# Patient Record
Sex: Female | Born: 1955 | Race: Black or African American | Hispanic: No | Marital: Single | Smoking: Former smoker
Health system: Southern US, Community
[De-identification: ages and names within clinical notes are randomized; demographics above are authoritative.]

## PROBLEM LIST (undated history)

## (undated) ENCOUNTER — Emergency Department (HOSPITAL_COMMUNITY): Admission: EM | Payer: BC Managed Care – PPO | Source: Home / Self Care

## (undated) DIAGNOSIS — G4730 Sleep apnea, unspecified: Secondary | ICD-10-CM

## (undated) DIAGNOSIS — I499 Cardiac arrhythmia, unspecified: Secondary | ICD-10-CM

## (undated) DIAGNOSIS — Z8719 Personal history of other diseases of the digestive system: Secondary | ICD-10-CM

## (undated) DIAGNOSIS — J189 Pneumonia, unspecified organism: Secondary | ICD-10-CM

## (undated) DIAGNOSIS — I739 Peripheral vascular disease, unspecified: Secondary | ICD-10-CM

## (undated) DIAGNOSIS — K219 Gastro-esophageal reflux disease without esophagitis: Secondary | ICD-10-CM

## (undated) DIAGNOSIS — F32A Depression, unspecified: Secondary | ICD-10-CM

## (undated) DIAGNOSIS — R600 Localized edema: Secondary | ICD-10-CM

## (undated) DIAGNOSIS — F419 Anxiety disorder, unspecified: Secondary | ICD-10-CM

## (undated) HISTORY — PX: CHOLECYSTECTOMY: SHX55

## (undated) HISTORY — DX: Localized edema: R60.0

## (undated) HISTORY — DX: Gastro-esophageal reflux disease without esophagitis: K21.9

## (undated) HISTORY — PX: COLONOSCOPY: SHX174

## (undated) HISTORY — PX: CATARACT EXTRACTION W/ INTRAOCULAR LENS IMPLANT: SHX1309

---

## 2001-11-04 ENCOUNTER — Other Ambulatory Visit: Admission: RE | Admit: 2001-11-04 | Discharge: 2001-11-04 | Payer: Self-pay | Admitting: Family Medicine

## 2007-07-17 ENCOUNTER — Encounter: Admission: RE | Admit: 2007-07-17 | Discharge: 2007-07-17 | Payer: Self-pay | Admitting: Internal Medicine

## 2007-08-28 ENCOUNTER — Encounter: Admission: RE | Admit: 2007-08-28 | Discharge: 2007-08-28 | Payer: Self-pay | Admitting: Internal Medicine

## 2008-04-13 ENCOUNTER — Encounter: Admission: RE | Admit: 2008-04-13 | Discharge: 2008-04-13 | Payer: Self-pay | Admitting: Internal Medicine

## 2008-04-17 ENCOUNTER — Encounter: Admission: RE | Admit: 2008-04-17 | Discharge: 2008-04-17 | Payer: Self-pay | Admitting: Internal Medicine

## 2008-04-24 ENCOUNTER — Ambulatory Visit: Payer: Self-pay | Admitting: Internal Medicine

## 2008-04-24 DIAGNOSIS — R930 Abnormal findings on diagnostic imaging of skull and head, not elsewhere classified: Secondary | ICD-10-CM | POA: Insufficient documentation

## 2008-04-27 ENCOUNTER — Encounter: Payer: Self-pay | Admitting: Internal Medicine

## 2008-04-27 ENCOUNTER — Ambulatory Visit: Payer: Self-pay | Admitting: Internal Medicine

## 2008-04-27 ENCOUNTER — Encounter: Admission: RE | Admit: 2008-04-27 | Discharge: 2008-04-27 | Payer: Self-pay | Admitting: Internal Medicine

## 2008-04-27 DIAGNOSIS — R0602 Shortness of breath: Secondary | ICD-10-CM | POA: Insufficient documentation

## 2008-05-02 DIAGNOSIS — N63 Unspecified lump in unspecified breast: Secondary | ICD-10-CM | POA: Insufficient documentation

## 2008-05-02 DIAGNOSIS — J309 Allergic rhinitis, unspecified: Secondary | ICD-10-CM | POA: Insufficient documentation

## 2008-05-02 DIAGNOSIS — K219 Gastro-esophageal reflux disease without esophagitis: Secondary | ICD-10-CM | POA: Insufficient documentation

## 2009-04-12 ENCOUNTER — Encounter: Admission: RE | Admit: 2009-04-12 | Discharge: 2009-04-12 | Payer: Self-pay | Admitting: Internal Medicine

## 2009-04-26 ENCOUNTER — Encounter: Admission: RE | Admit: 2009-04-26 | Discharge: 2009-04-26 | Payer: Self-pay | Admitting: Internal Medicine

## 2009-04-29 ENCOUNTER — Encounter: Admission: RE | Admit: 2009-04-29 | Discharge: 2009-04-29 | Payer: Self-pay | Admitting: Internal Medicine

## 2009-10-26 ENCOUNTER — Encounter: Admission: RE | Admit: 2009-10-26 | Discharge: 2009-10-26 | Payer: Self-pay | Admitting: Internal Medicine

## 2010-04-27 ENCOUNTER — Encounter: Admission: RE | Admit: 2010-04-27 | Discharge: 2010-04-27 | Payer: Self-pay | Admitting: Internal Medicine

## 2010-09-18 ENCOUNTER — Encounter: Payer: Self-pay | Admitting: Internal Medicine

## 2011-12-01 ENCOUNTER — Other Ambulatory Visit: Payer: Self-pay | Admitting: Internal Medicine

## 2011-12-01 DIAGNOSIS — Z1231 Encounter for screening mammogram for malignant neoplasm of breast: Secondary | ICD-10-CM

## 2011-12-11 ENCOUNTER — Ambulatory Visit: Payer: Self-pay

## 2011-12-26 ENCOUNTER — Inpatient Hospital Stay: Admission: RE | Admit: 2011-12-26 | Payer: Self-pay | Source: Ambulatory Visit

## 2012-01-10 ENCOUNTER — Ambulatory Visit
Admission: RE | Admit: 2012-01-10 | Discharge: 2012-01-10 | Disposition: A | Discharge status: Home / Self Care | Payer: Managed Care, Other (non HMO) | Source: Ambulatory Visit | Attending: Internal Medicine | Admitting: Internal Medicine

## 2012-01-10 DIAGNOSIS — Z1231 Encounter for screening mammogram for malignant neoplasm of breast: Secondary | ICD-10-CM

## 2014-02-16 ENCOUNTER — Other Ambulatory Visit: Payer: Self-pay

## 2014-02-16 DIAGNOSIS — Z1231 Encounter for screening mammogram for malignant neoplasm of breast: Secondary | ICD-10-CM

## 2014-02-23 ENCOUNTER — Ambulatory Visit: Payer: Managed Care, Other (non HMO)

## 2014-02-23 ENCOUNTER — Other Ambulatory Visit: Payer: Self-pay | Admitting: Internal Medicine

## 2014-02-23 DIAGNOSIS — N63 Unspecified lump in unspecified breast: Secondary | ICD-10-CM

## 2014-03-04 ENCOUNTER — Ambulatory Visit
Admission: RE | Admit: 2014-03-04 | Discharge: 2014-03-04 | Disposition: A | Discharge status: Home / Self Care | Payer: BC Managed Care – PPO | Source: Ambulatory Visit | Attending: Internal Medicine | Admitting: Internal Medicine

## 2014-03-04 DIAGNOSIS — N63 Unspecified lump in unspecified breast: Secondary | ICD-10-CM

## 2015-02-11 ENCOUNTER — Other Ambulatory Visit: Payer: Self-pay | Admitting: Internal Medicine

## 2015-02-11 DIAGNOSIS — Z1231 Encounter for screening mammogram for malignant neoplasm of breast: Secondary | ICD-10-CM

## 2015-03-10 ENCOUNTER — Ambulatory Visit
Admission: RE | Admit: 2015-03-10 | Discharge: 2015-03-10 | Disposition: A | Discharge status: Home / Self Care | Payer: 59 | Source: Ambulatory Visit | Attending: Internal Medicine | Admitting: Internal Medicine

## 2015-03-10 DIAGNOSIS — Z1231 Encounter for screening mammogram for malignant neoplasm of breast: Secondary | ICD-10-CM

## 2016-02-23 ENCOUNTER — Telehealth: Payer: Self-pay | Admitting: Cardiovascular Disease

## 2016-02-23 NOTE — Telephone Encounter (Signed)
Received records from [facility] for appointment on 03/27/16 with Dr Trenton.  Records given to N Hines (medical records) for Dr Marquand's schedule on 03/27/16. lp °

## 2016-03-27 ENCOUNTER — Encounter: Payer: Self-pay | Admitting: *Deleted

## 2016-03-27 ENCOUNTER — Ambulatory Visit (INDEPENDENT_AMBULATORY_CARE_PROVIDER_SITE_OTHER): Payer: Self-pay | Admitting: Cardiovascular Disease

## 2016-03-27 ENCOUNTER — Encounter: Payer: Self-pay | Admitting: Cardiovascular Disease

## 2016-03-27 VITALS — BP 117/74 | HR 64 | Ht 64.8 in | Wt 189.0 lb

## 2016-03-27 DIAGNOSIS — R600 Localized edema: Secondary | ICD-10-CM | POA: Insufficient documentation

## 2016-03-27 DIAGNOSIS — K219 Gastro-esophageal reflux disease without esophagitis: Secondary | ICD-10-CM | POA: Insufficient documentation

## 2016-03-27 MED ORDER — FUROSEMIDE 20 MG PO TABS: 20.0000 mg | ORAL_TABLET | Freq: Every day | ORAL | 5 refills | Status: DC | PRN

## 2016-03-27 NOTE — Patient Instructions (Addendum)
Medication Instructions:  STOP HYDROCHLOROTHIAZIDE   START FUROSEMIDE (LASIX) 20 MG DAILY  Labwork: FASTING LP/CMET AT [facility] ON THE FIRST FLOOR SOON  Testing/Procedures: Your physician has requested that you have an echocardiogram. Echocardiography is a painless test that uses sound waves to create images of your heart. It provides your doctor with information about the size and shape of your heart and how well your heart's chambers and valves are working. This procedure takes approximately one hour. There are no restrictions for this procedure. [facility] AT [address]  Follow-Up: AS NEEDED   If you need a refill on your cardiac medications before your next appointment, please call your pharmacy.

## 2016-03-27 NOTE — Progress Notes (Signed)
Cardiology Office Note   Date:  03/27/2016   ID:  Amy Greer, DOB [birth year], MRN [id number]  PCP:  Gwynneth Aliment, MD  Cardiologist:   Chilton Si, MD   Chief Complaint  Patient presents with  . New Patient (Initial Visit)    edema; in ankles and feet      History of Present Illness: Amy Greer is a 60 y.o. female with GERD who presents for an evaluation of lower extremity edema.  Amy Greer was seen by Dr. Dorothyann Peng on 01/2016. At that appointment she reported lower extremity edema and was referred to cardiology for further evaluation.  Amy Greer notes some swelling of her right ankle since she twisted it over 10 years ago. However, over the last several years she has noted some increased bilateral lower extremity edema that is worse in the right than the left. This seems to be worse in the summer time and improves when the weather is cooler. It improves somewhat with elevation but does not completely go away. She denies any chest pain but does note some shortness of breath with exertion. This has been stable over the last year. She denies orthopnea or PND.  Dr. Allyne Gee started her on hydrochlorothiazide 12.5 mg daily as needed for edema. This has helped with her swelling she notices that she feels lightheaded or dizzy when she takes it. She has never had high blood pressure in the past.  She has also noted some dark discoloration around her R ankle.    Amy Greer does not get any regular exercise.  She has been busy with work and other obligations.  She quit smoking 1 year ago and reports that she was never a heavy smoker.  She reports a 10 lb weight gain that she attributes to poor diet and not exercising.    Past Medical History:  Diagnosis Date  . GERD (gastroesophageal reflux disease)   . Lower extremity edema     No past surgical history on file.   Current Outpatient Prescriptions  Medication Sig Dispense Refill  . Ascorbic Acid (VITAMIN C) 1000 MG  tablet Take 1,000 mg by mouth as directed. 1-2 TABLETS DAILY    . Cholecalciferol (VITAMIN D3) 1000 units CAPS Take by mouth as directed. 2-3 BY MOUTH DAILY    . Multiple Vitamins-Minerals (MEGA MULTIVITAMIN FOR WOMEN PO) Take by mouth daily.    . furosemide (LASIX) 20 MG tablet Take 1 tablet (20 mg total) by mouth daily as needed for edema. 30 tablet 5  . omeprazole (PRILOSEC) 40 MG capsule Take 40 mg by mouth daily.     No current facility-administered medications for this visit.     Allergies:   Penicillins    Social History:  The patient  reports that she has quit smoking. She does not have any smokeless tobacco history on file.   Family History:  The patient's family history includes Hyperlipidemia in her mother; Kidney disease in her mother; Lung cancer in her brother and sister.    ROS:  Please see the history of present illness.   Otherwise, review of systems are positive for none.   All other systems are reviewed and negative.    PHYSICAL EXAM: VS:  BP 117/74   Pulse 64   Ht 5' 4.8" (1.646 m)   Wt 189 lb (85.7 kg)   BMI 31.65 kg/m  , BMI Body mass index is 31.65 kg/m. GENERAL:  Well appearing HEENT:  Pupils equal round and  reactive, fundi not visualized, oral mucosa unremarkable NECK:  No jugular venous distention, waveform within normal limits, carotid upstroke brisk and symmetric, no bruits, no thyromegaly LYMPHATICS:  No cervical adenopathy LUNGS:  Clear to auscultation bilaterally HEART:  RRR.  PMI not displaced or sustained,S1 and S2 within normal limits, no S3, no S4, no clicks, no rubs, no murmurs ABD:  Flat, positive bowel sounds normal in frequency in pitch, no bruits, no rebound, no guarding, no midline pulsatile mass, no hepatomegaly, no splenomegaly EXT:  2 plus pulses throughout, trace pitting edema to bilateral lower tibia, no cyanosis no clubbing SKIN:  No rashes no nodules.  Mild hyperpigmentation of the medial and lateral R ankle NEURO:  Cranial nerves  II through XII grossly intact, motor grossly intact throughout PSYCH:  Cognitively intact, oriented to person place and time   EKG:  EKG is ordered today. The ekg ordered today demonstrates sinus rhythm. Rate 64 bpm.   Recent Labs: No results found for requested labs within last 8760 hours.    Lipid Panel No results found for: CHOL, TRIG, HDL, CHOLHDL, VLDL, LDLCALC, LDLDIRECT    Wt Readings from Last 3 Encounters:  03/27/16 189 lb (85.7 kg)  04/24/08 171 lb 8 oz (77.8 kg)      ASSESSMENT AND PLAN:  # LE edema: Amy Greer reports bilateral lower extremity edema and some exertional shortness of breath. She does not have any evidence of heart failure on exam. I suspect that her dyspnea is due to deconditioning and obesity. Her lower shin edema is likely due to venous insufficiency. I recommended that she get up and walk some while at work and keep her legs elevated if possible. I also recommended that she wear compression stockings or socks. We will check an echocardiogram to ensure that she does not have any heart failure. We will stop hydrochlorothiazide as she has noted some lightheadedness when she uses medicine. We will give her Lasix 20 mg taken daily when necessary when she noted significant edema.   # CV Disease Prevention: We will check fasting lipids, and the metabolic panel. I encouraged her to his exercise to at least 30-40 minutes most days of the week.    Current medicines are reviewed at length with the patient today.  The patient does not have concerns regarding medicines.  The following changes have been made:  Stop hydrochlorothiazide and start Lasix 20 mg daily as needed.  Labs/ tests ordered today include:   Orders Placed This Encounter  Procedures  . EKG 12-Lead  . ECHOCARDIOGRAM COMPLETE     Disposition:   FU with Amy Leece C. Duke Salvia, MD, [facility] as needed    This note was written with the assistance of speech recognition software.  Please excuse any  transcriptional errors.  Signed, Pernell Lenoir C. Duke Salvia, MD, [facility]  03/27/2016 5:18 PM    [facility]

## 2016-04-24 ENCOUNTER — Other Ambulatory Visit: Payer: Self-pay

## 2016-04-24 ENCOUNTER — Ambulatory Visit (INDEPENDENT_AMBULATORY_CARE_PROVIDER_SITE_OTHER): Payer: Self-pay

## 2016-04-24 ENCOUNTER — Telehealth: Payer: Self-pay | Admitting: Cardiovascular Disease

## 2016-04-24 DIAGNOSIS — R600 Localized edema: Secondary | ICD-10-CM

## 2016-04-24 NOTE — Telephone Encounter (Signed)
Pt calling asking if we can mail her off a letter for her job stating she came to our office for an echo this morning She forgot to ask when she was here.

## 2016-08-28 HISTORY — PX: CHOLECYSTECTOMY: SHX55

## 2017-09-05 DIAGNOSIS — E2839 Other primary ovarian failure: Secondary | ICD-10-CM | POA: Diagnosis not present

## 2017-09-05 DIAGNOSIS — K219 Gastro-esophageal reflux disease without esophagitis: Secondary | ICD-10-CM | POA: Diagnosis not present

## 2017-09-05 DIAGNOSIS — Z0000 Encounter for general adult medical examination without abnormal findings: Secondary | ICD-10-CM | POA: Diagnosis not present

## 2017-09-14 ENCOUNTER — Other Ambulatory Visit: Payer: Self-pay | Admitting: Internal Medicine

## 2017-09-14 DIAGNOSIS — E2839 Other primary ovarian failure: Secondary | ICD-10-CM

## 2017-10-02 ENCOUNTER — Inpatient Hospital Stay
Admission: RE | Admit: 2017-10-02 | Discharge: 2017-10-02 | Disposition: A | Discharge status: Home / Self Care | Payer: Self-pay | Source: Ambulatory Visit | Attending: Internal Medicine | Admitting: Internal Medicine

## 2017-10-22 ENCOUNTER — Other Ambulatory Visit (HOSPITAL_COMMUNITY)
Admission: RE | Admit: 2017-10-22 | Discharge: 2017-10-22 | Disposition: A | Discharge status: Home / Self Care | Payer: BLUE CROSS/BLUE SHIELD | Source: Ambulatory Visit | Attending: Obstetrics and Gynecology | Admitting: Obstetrics and Gynecology

## 2017-10-22 ENCOUNTER — Other Ambulatory Visit: Payer: Self-pay | Admitting: Obstetrics and Gynecology

## 2017-10-22 DIAGNOSIS — Z124 Encounter for screening for malignant neoplasm of cervix: Secondary | ICD-10-CM | POA: Diagnosis not present

## 2017-10-22 DIAGNOSIS — Z01411 Encounter for gynecological examination (general) (routine) with abnormal findings: Secondary | ICD-10-CM | POA: Diagnosis not present

## 2017-10-22 DIAGNOSIS — N898 Other specified noninflammatory disorders of vagina: Secondary | ICD-10-CM | POA: Diagnosis not present

## 2017-10-22 DIAGNOSIS — R1032 Left lower quadrant pain: Secondary | ICD-10-CM | POA: Diagnosis not present

## 2017-10-24 LAB — CYTOLOGY - PAP
Diagnosis: NEGATIVE
HPV: NOT DETECTED

## 2017-10-25 ENCOUNTER — Other Ambulatory Visit: Payer: Self-pay | Admitting: Internal Medicine

## 2017-10-25 ENCOUNTER — Ambulatory Visit
Admission: RE | Admit: 2017-10-25 | Discharge: 2017-10-25 | Disposition: A | Discharge status: Home / Self Care | Payer: BLUE CROSS/BLUE SHIELD | Source: Ambulatory Visit | Attending: Internal Medicine | Admitting: Internal Medicine

## 2017-10-25 DIAGNOSIS — Z1231 Encounter for screening mammogram for malignant neoplasm of breast: Secondary | ICD-10-CM | POA: Diagnosis not present

## 2017-10-26 DIAGNOSIS — K219 Gastro-esophageal reflux disease without esophagitis: Secondary | ICD-10-CM | POA: Diagnosis not present

## 2017-10-26 DIAGNOSIS — R1310 Dysphagia, unspecified: Secondary | ICD-10-CM | POA: Diagnosis not present

## 2017-10-30 DIAGNOSIS — K228 Other specified diseases of esophagus: Secondary | ICD-10-CM | POA: Diagnosis not present

## 2017-10-30 DIAGNOSIS — K449 Diaphragmatic hernia without obstruction or gangrene: Secondary | ICD-10-CM | POA: Diagnosis not present

## 2017-10-30 DIAGNOSIS — R1310 Dysphagia, unspecified: Secondary | ICD-10-CM | POA: Diagnosis not present

## 2017-10-31 ENCOUNTER — Ambulatory Visit
Admission: RE | Admit: 2017-10-31 | Discharge: 2017-10-31 | Disposition: A | Discharge status: Home / Self Care | Payer: BLUE CROSS/BLUE SHIELD | Source: Ambulatory Visit | Attending: Internal Medicine | Admitting: Internal Medicine

## 2017-10-31 DIAGNOSIS — Z780 Asymptomatic menopausal state: Secondary | ICD-10-CM | POA: Diagnosis not present

## 2017-10-31 DIAGNOSIS — E2839 Other primary ovarian failure: Secondary | ICD-10-CM

## 2017-10-31 DIAGNOSIS — M8589 Other specified disorders of bone density and structure, multiple sites: Secondary | ICD-10-CM | POA: Diagnosis not present

## 2018-10-09 ENCOUNTER — Encounter: Payer: BLUE CROSS/BLUE SHIELD | Admitting: Internal Medicine

## 2018-11-26 ENCOUNTER — Ambulatory Visit (INDEPENDENT_AMBULATORY_CARE_PROVIDER_SITE_OTHER): Payer: BLUE CROSS/BLUE SHIELD | Admitting: Nurse Practitioner

## 2018-11-26 ENCOUNTER — Encounter: Payer: Self-pay | Admitting: Nurse Practitioner

## 2018-11-26 DIAGNOSIS — M62830 Muscle spasm of back: Secondary | ICD-10-CM

## 2018-11-26 MED ORDER — CYCLOBENZAPRINE HCL 10 MG PO TABS
10.0000 mg | ORAL_TABLET | Freq: Three times a day (TID) | ORAL | 0 refills | Status: DC | PRN
Start: 2018-11-26 — End: 2019-02-03

## 2018-11-26 MED ORDER — CYCLOBENZAPRINE HCL 10 MG PO TABS: 10.0000 mg | ORAL_TABLET | Freq: Three times a day (TID) | ORAL | 0 refills | Status: DC | PRN

## 2018-11-26 NOTE — Progress Notes (Addendum)
This visit type was conducted due to national recommendations for restrictions regarding the COVID-19 Pandemic (e.g. social distancing).  This format is felt to be most appropriate for this patient at this time.  All issues noted in this document were discussed and addressed.  No physical exam was performed (except for noted visual exam findings with Video Visits).  Please refer to the patient's chart (MyChart message for video visits and phone note for telephone visits) for the patient's consent to telehealth for [facility].   Subjective:     Patient ID: Amy Greer , female    DOB: 1956-07-23 , 63 y.o.   MRN: [id number]  Virtual Visit via Telephone Note  Patient location - Home  Provider location - Office   I connected with Amy Greer on 11/27/18 at 10:15 AM EDT by telephone and verified that I am speaking with the correct person using two identifiers.   I discussed the limitations, risks, security and privacy concerns of performing an evaluation and management service by telephone and the availability of in person appointments. I also discussed with the patient that there may be a patient responsible charge related to this service. The patient expressed understanding and agreed to proceed.  Chief Complaint  Patient presents with  . Back Pain    History of Present Illness:   Back Pain  This is a new problem. The current episode started in the past 7 days. The problem occurs constantly. The problem has been gradually worsening since onset. The quality of the pain is described as cramping. The pain does not radiate. The pain is moderate. The pain is the same all the time. Pertinent negatives include no chest pain, fever or headaches. Risk factors include sedentary lifestyle (currently working from home). She has tried heat and bed rest for the symptoms.     Past Medical History:  Diagnosis Date  . GERD (gastroesophageal reflux disease)   . Lower extremity edema       Family History  Problem Relation Age of Onset  . Kidney disease Mother   . Hyperlipidemia Mother   . Lung cancer Sister   . Lung cancer Brother      Current Outpatient Medications:  .  Ascorbic Acid (VITAMIN C) 1000 MG tablet, Take 1,000 mg by mouth as directed. 1-2 TABLETS DAILY, Disp: , Rfl:  .  Cholecalciferol (VITAMIN D3) 1000 units CAPS, Take by mouth as directed. 2-3 BY MOUTH DAILY, Disp: , Rfl:  .  cyclobenzaprine (FLEXERIL) 10 MG tablet, Take 1 tablet (10 mg total) by mouth 3 (three) times daily as needed for muscle spasms., Disp: 30 tablet, Rfl: 0 .  furosemide (LASIX) 20 MG tablet, Take 1 tablet (20 mg total) by mouth daily as needed for edema., Disp: 30 tablet, Rfl: 5 .  gabapentin (NEURONTIN) 100 MG capsule, Take 1 capsule (100 mg total) by mouth 3 (three) times daily as needed., Disp: 30 capsule, Rfl: 0 .  Multiple Vitamins-Minerals (MEGA MULTIVITAMIN FOR WOMEN PO), Take by mouth daily., Disp: , Rfl:  .  omeprazole (PRILOSEC) 40 MG capsule, Take 40 mg by mouth daily., Disp: , Rfl:    Allergies  Allergen Reactions  . Penicillins     SWELLING OF THE THROAT      Review of Systems  Constitutional: Negative for fatigue and fever.  Respiratory: Negative for cough.   Cardiovascular: Negative for chest pain, palpitations and leg swelling.  Musculoskeletal: Positive for back pain (low back pain with radiation down right leg).  Neurological: Negative for dizziness and headaches.     There were no vitals filed for this visit.  Observations/Objective: She does not have access to checking her blood pressure or temperature   Physical Exam ______Constitutional:      Comments: grimacing when moving briefly.  Patient yells out during video call in pain  ____________________Pulmonary:     Effort: Pulmonary effort is normal.  ________________Musculoskeletal:     Comments: Unable to change positions without grimacing or moaning  ________________Neurological:     General: No  focal deficit present.     Mental Status: She is alert.  ____Psychiatric:        Mood and Affect: Mood normal.        Thought Content: Thought content normal.        Judgment: Judgment normal.        Assessment and Plan: 1. Back spasm  Will treat with cyclobenzaprine  Advised to be off today and tomorrow  Encouraged to stretch regularly especially while she is confined to home - cyclobenzaprine (FLEXERIL) 10 MG tablet; Take 1 tablet (10 mg total) by mouth 3 (three) times daily as needed for muscle spasms.  Dispense: 30 tablet; Refill: 0 - gabapentin (NEURONTIN) 100 MG capsule; Take 1 capsule (100 mg total) by mouth 3 (three) times daily as needed.  Dispense: 30 capsule; Refill: 0   Follow Up Instructions: Return call to office if symptoms worsen Work note until Wednesday  I discussed the assessment and treatment plan with the patient. The patient was provided an opportunity to ask questions and all were answered. The patient agreed with the plan and demonstrated an understanding of the instructions.   The patient was advised to call back or seek an in-person evaluation if the symptoms worsen or if the condition fails to improve as anticipated.  COVID-19 Education: The signs and symptoms of COVID-19 were discussed with the patient and how to seek care for testing (follow up with PCP or arrange E-visit).  The importance of social distancing was discussed today.   Patient Risk:   After full review of this patients clinical status, I feel that they are at least moderate risk at this time.   Discussed social distancing   Arnette Felts, FNP

## 2018-11-27 ENCOUNTER — Encounter: Payer: Self-pay | Admitting: Nurse Practitioner

## 2018-11-27 ENCOUNTER — Other Ambulatory Visit: Payer: Self-pay

## 2018-11-27 DIAGNOSIS — M62830 Muscle spasm of back: Secondary | ICD-10-CM | POA: Insufficient documentation

## 2018-11-27 MED ORDER — GABAPENTIN 100 MG PO CAPS: 100.0000 mg | ORAL_CAPSULE | Freq: Three times a day (TID) | ORAL | 0 refills | Status: DC | PRN

## 2018-12-25 ENCOUNTER — Telehealth: Payer: Self-pay

## 2018-12-25 NOTE — Telephone Encounter (Signed)
Patient called requesting a call back.  RETURNED CALL LEFT V/M TO CALL OFFICE BACK YRL,RMA

## 2018-12-31 ENCOUNTER — Telehealth: Payer: Self-pay | Admitting: Internal Medicine

## 2018-12-31 NOTE — Telephone Encounter (Signed)
Patient called wanting a appt called her back no answer and mailbox full

## 2019-02-03 ENCOUNTER — Encounter: Payer: Self-pay | Admitting: Nurse Practitioner

## 2019-02-03 ENCOUNTER — Other Ambulatory Visit: Payer: Self-pay

## 2019-02-03 ENCOUNTER — Ambulatory Visit (INDEPENDENT_AMBULATORY_CARE_PROVIDER_SITE_OTHER): Payer: BC Managed Care – PPO | Admitting: Nurse Practitioner

## 2019-02-03 VITALS — BP 118/82 | HR 68 | Temp 97.9°F | Ht 65.0 in | Wt 204.0 lb

## 2019-02-03 DIAGNOSIS — R600 Localized edema: Secondary | ICD-10-CM

## 2019-02-03 DIAGNOSIS — E559 Vitamin D deficiency, unspecified: Secondary | ICD-10-CM | POA: Diagnosis not present

## 2019-02-03 DIAGNOSIS — Z139 Encounter for screening, unspecified: Secondary | ICD-10-CM | POA: Diagnosis not present

## 2019-02-03 DIAGNOSIS — E66813 Obesity, class 3: Secondary | ICD-10-CM | POA: Insufficient documentation

## 2019-02-03 DIAGNOSIS — Z1159 Encounter for screening for other viral diseases: Secondary | ICD-10-CM | POA: Diagnosis not present

## 2019-02-03 DIAGNOSIS — Z6841 Body Mass Index (BMI) 40.0 and over, adult: Secondary | ICD-10-CM | POA: Diagnosis not present

## 2019-02-03 DIAGNOSIS — Z0000 Encounter for general adult medical examination without abnormal findings: Secondary | ICD-10-CM

## 2019-02-03 LAB — POCT URINALYSIS DIPSTICK
Bilirubin, UA: NEGATIVE
Glucose, UA: NEGATIVE
Ketones, UA: NEGATIVE
Leukocytes, UA: NEGATIVE
Nitrite, UA: NEGATIVE
Protein, UA: NEGATIVE
Spec Grav, UA: 1.025 (ref 1.010–1.025)
Urobilinogen, UA: 0.2 E.U./dL
pH, UA: 5.5 (ref 5.0–8.0)

## 2019-02-03 MED ORDER — FUROSEMIDE 20 MG PO TABS: 20.0000 mg | ORAL_TABLET | Freq: Every day | ORAL | 1 refills | Status: DC | PRN

## 2019-02-03 NOTE — Patient Instructions (Addendum)

## 2019-02-03 NOTE — Progress Notes (Signed)
Subjective:     Patient ID: Amy Greer , female    DOB: 10/08/1955 , 63 y.o.   MRN: [id number]   Chief Complaint  Patient presents with  . Annual Exam   The patient states she uses post menopausal status for birth control. Last LMP was No LMP recorded. Patient is postmenopausal.. Negative for Dysmenorrhea and Negative for Menorrhagia Mammogram last done.  Negative for: breast discharge, breast lump(s), breast pain and breast self exam.  Pertinent negatives include abnormal bleeding (hematology), anxiety, decreased libido, depression, difficulty falling sleep, dyspareunia, history of infertility, nocturia, sexual dysfunction, sleep disturbances, urinary incontinence, urinary urgency, vaginal discharge and vaginal itching. Diet regular.The patient states her exercise level is   none   The patient's tobacco use is:  Social History   Tobacco Use  Smoking Status Former Smoker  Smokeless Tobacco Never Used  Tobacco Comment   1 year ago   She has been exposed to passive smoke. The patient's alcohol use is:  Social History   Substance and Sexual Activity  Alcohol Use Not on file   Additional information: Last pap 2019 Dr. Gunnar BullaVernado, next one scheduled for 2021   HPI  Wt Readings from Last 3 Encounters: 02/03/19 : 204 lb (92.5 kg) 11/05/16 : 188 lb (85.3 kg) 03/27/16 : 189 lb (85.7 kg)  Weight 193.4 in 08/2017 -     Past Medical History:  Diagnosis Date  . GERD (gastroesophageal reflux disease)   . Lower extremity edema      Family History  Problem Relation Age of Onset  . Kidney disease Mother   . Hyperlipidemia Mother   . Lung cancer Sister   . Lung cancer Brother      Current Outpatient Medications:  .  Ascorbic Acid (VITAMIN C) 1000 MG tablet, Take 1,000 mg by mouth as directed. 1-2 TABLETS DAILY, Disp: , Rfl:  .  Cholecalciferol (VITAMIN D3) 1000 units CAPS, Take by mouth as directed. 2-3 BY MOUTH DAILY, Disp: , Rfl:  .  furosemide (LASIX) 20 MG tablet, Take  1 tablet (20 mg total) by mouth daily as needed for edema., Disp: 30 tablet, Rfl: 5   Allergies  Allergen Reactions  . Penicillins     SWELLING OF THE THROAT      Review of Systems  Constitutional: Negative.   HENT: Negative.   Eyes: Negative.   Respiratory: Negative.   Cardiovascular: Negative.  Negative for chest pain, palpitations and leg swelling.  Gastrointestinal: Negative.   Endocrine: Negative.   Genitourinary: Negative.   Musculoskeletal: Negative.   Skin: Negative.   Allergic/Immunologic: Negative.   Neurological: Negative.  Negative for dizziness and headaches.  Hematological: Negative.   Psychiatric/Behavioral: Negative.      Today's Vitals   02/03/19 1427  BP: 118/82  Pulse: 68  Temp: 97.9 F (36.6 C)  TempSrc: Oral  Weight: 204 lb (92.5 kg)  Height: 5\' 5"  (1.651 m)  PainSc: 0-No pain   Body mass index is 33.95 kg/m.   Objective:  Physical Exam Constitutional:      Appearance: Normal appearance. She is well-developed.  HENT:     Head: Normocephalic and atraumatic.     Right Ear: Hearing, tympanic membrane, ear canal and external ear normal.     Left Ear: Hearing, tympanic membrane, ear canal and external ear normal.     Nose: Nose normal.     Mouth/Throat:     Mouth: Mucous membranes are moist.  Eyes:     General: Lids are  normal.     Conjunctiva/sclera: Conjunctivae normal.     Pupils: Pupils are equal, round, and reactive to light.     Funduscopic exam:    Right eye: No papilledema.        Left eye: No papilledema.  Neck:     Musculoskeletal: Full passive range of motion without pain, normal range of motion and neck supple.     Thyroid: No thyroid mass.     Vascular: No carotid bruit.  Cardiovascular:     Rate and Rhythm: Normal rate and regular rhythm.     Pulses: Normal pulses.     Heart sounds: Normal heart sounds. No murmur.  Pulmonary:     Effort: Pulmonary effort is normal.     Breath sounds: Normal breath sounds.  Abdominal:      General: Abdomen is flat. Bowel sounds are normal.     Palpations: Abdomen is soft.  Musculoskeletal: Normal range of motion.        General: No swelling.     Right lower leg: No edema.     Left lower leg: No edema.  Skin:    General: Skin is warm and dry.     Capillary Refill: Capillary refill takes less than 2 seconds.  Neurological:     General: No focal deficit present.     Mental Status: She is alert and oriented to person, place, and time.     Cranial Nerves: No cranial nerve deficit.     Sensory: No sensory deficit.  Psychiatric:        Mood and Affect: Mood normal.        Behavior: Behavior normal.        Thought Content: Thought content normal.        Judgment: Judgment normal.         Assessment And Plan:     1. Encounter for general adult medical examination w/o abnormal findings . Behavior modifications discussed and diet history reviewed.   . Pt will continue to exercise regularly and modify diet with low GI, plant based foods and decrease intake of processed foods.  . Recommend intake of daily multivitamin, Vitamin D, and calcium.  . Recommend mammogram and colonoscopy for preventive screenings, as well as recommend immunizations that include influenza, TDAP, and Shingles - POCT Urinalysis Dipstick (81002)  2. Lower extremity edema  Trace to 1+ edema to bilateral lower extremities  Will provide as needed furosemide and encouraged to increase water and potassium intake  Encouraged to focus on weight loss and be more physically active - furosemide (LASIX) 20 MG tablet; Take 1 tablet (20 mg total) by mouth daily as needed for edema.  Dispense: 30 tablet; Refill: 1  3. Class 3 severe obesity due to excess calories without serious comorbidity with body mass index (BMI) of 40.0 to 44.9 in adult [facility](HCC)  Chronic  Discussed healthy diet and regular exercise options   Encouraged to exercise at least 150 minutes per week with 2 days of strength training  Given  handout for [employer]dkmyplate.gov 10 tips, CDC exercise for adults and CDC Eat More Weight Less - Hemoglobin A1c - CMP14 + Anion Gap - CBC no Diff  4. Encounter for hepatitis C screening test for low risk patient  Will check for Hepatitis C screening due to being born between the years 1945-1965 - Hepatitis C antibody  5. Encounter for screening  - HIV antibody (with reflex)  6. Vitamin D deficiency  Will check vitamin D level and  supplement as needed.     Also encouraged to spend 15 minutes in the sun daily.  - Vitamin D (25 hydroxy)   Minette Brine, FNP    THE PATIENT IS ENCOURAGED TO PRACTICE SOCIAL DISTANCING DUE TO THE COVID-19 PANDEMIC.

## 2019-02-04 LAB — CMP14 + ANION GAP
ALT: 23 IU/L (ref 0–32)
AST: 19 IU/L (ref 0–40)
Albumin/Globulin Ratio: 1.7 (ref 1.2–2.2)
Albumin: 4.6 g/dL (ref 3.8–4.8)
Alkaline Phosphatase: 92 IU/L (ref 39–117)
Anion Gap: 12 mmol/L (ref 10.0–18.0)
BUN/Creatinine Ratio: 6 — ABNORMAL LOW (ref 12–28)
BUN: 6 mg/dL — ABNORMAL LOW (ref 8–27)
Bilirubin Total: 0.3 mg/dL (ref 0.0–1.2)
CO2: 27 mmol/L (ref 20–29)
Calcium: 9.2 mg/dL (ref 8.7–10.3)
Chloride: 102 mmol/L (ref 96–106)
Creatinine, Ser: 0.95 mg/dL (ref 0.57–1.00)
GFR calc Af Amer: 75 mL/min/{1.73_m2} (ref >59)
GFR calc non Af Amer: 65 mL/min/{1.73_m2} (ref >59)
Globulin, Total: 2.7 g/dL (ref 1.5–4.5)
Glucose: 83 mg/dL (ref 65–99)
Potassium: 3.9 mmol/L (ref 3.5–5.2)
Sodium: 141 mmol/L (ref 134–144)
Total Protein: 7.3 g/dL (ref 6.0–8.5)

## 2019-02-04 LAB — VITAMIN D 25 HYDROXY (VIT D DEFICIENCY, FRACTURES): Vit D, 25-Hydroxy: 43.8 ng/mL (ref 30.0–100.0)

## 2019-02-04 LAB — CBC
Hematocrit: 41.1 % (ref 34.0–46.6)
Hemoglobin: 13.7 g/dL (ref 11.1–15.9)
MCH: 31.1 pg (ref 26.6–33.0)
MCHC: 33.3 g/dL (ref 31.5–35.7)
MCV: 93 fL (ref 79–97)
Platelets: 302 10*3/uL (ref 150–450)
RBC: 4.4 x10E6/uL (ref 3.77–5.28)
RDW: 13.2 % (ref 11.7–15.4)
WBC: 5.9 10*3/uL (ref 3.4–10.8)

## 2019-02-04 LAB — HIV ANTIBODY (ROUTINE TESTING W REFLEX): HIV Screen 4th Generation wRfx: NONREACTIVE

## 2019-02-04 LAB — HEMOGLOBIN A1C
Est. average glucose Bld gHb Est-mCnc: 117 mg/dL
Hgb A1c MFr Bld: 5.7 % — ABNORMAL HIGH (ref 4.8–5.6)

## 2019-02-04 LAB — HEPATITIS C ANTIBODY: Hep C Virus Ab: <0.1 s/co ratio (ref 0.0–0.9)

## 2019-08-05 ENCOUNTER — Telehealth: Payer: Self-pay

## 2019-08-05 NOTE — Telephone Encounter (Signed)
PT CALLED TO CONFIRM THAT APPT HAS BEEN CANCELLED FOR 12/9.CALLED PT BACK TO ADVISE NO ANS UNABLE TO LVM DUE TO MAILBOX FULL

## 2019-08-06 ENCOUNTER — Ambulatory Visit: Payer: BC Managed Care – PPO | Admitting: Nurse Practitioner

## 2019-08-06 LAB — HM MAMMOGRAPHY

## 2019-08-20 ENCOUNTER — Encounter: Payer: Self-pay | Admitting: Internal Medicine

## 2019-09-16 ENCOUNTER — Telehealth: Payer: Self-pay | Admitting: *Deleted

## 2019-09-16 NOTE — Telephone Encounter (Signed)
Patient calling to schedule appt for COVID19 vaccine. Pt informed that at this time current appts were being offered to 16 year olds and older. Understanding verbalized.

## 2019-10-20 ENCOUNTER — Ambulatory Visit: Payer: Self-pay | Attending: Family

## 2019-10-20 DIAGNOSIS — Z23 Encounter for immunization: Secondary | ICD-10-CM | POA: Insufficient documentation

## 2019-10-20 NOTE — Progress Notes (Signed)
   Covid-19 Vaccination Clinic  Name:  Amy Greer    MRN: [id number] DOB: May 11, 1956  10/20/2019  [name] was observed post Covid-19 immunization for 15 minutes without incidence. She was provided with Vaccine Information Sheet and instruction to access the V-Safe system.   [name] was instructed to call 911 with any severe reactions post vaccine: Marland Kitchen Difficulty breathing  . Swelling of your face and throat  . A fast heartbeat  . A bad rash all over your body  . Dizziness and weakness    Immunizations Administered    Name Date Dose VIS Date Route   Moderna COVID-19 Vaccine 10/20/2019 10:01 AM 0.5 mL 07/29/2019 Intramuscular   Manufacturer: Moderna   Lot: [id number]   NDC: 41583-094-07

## 2019-11-18 ENCOUNTER — Ambulatory Visit: Payer: Self-pay | Attending: Family

## 2019-11-18 DIAGNOSIS — Z23 Encounter for immunization: Secondary | ICD-10-CM

## 2019-11-18 NOTE — Progress Notes (Signed)
   Covid-19 Vaccination Clinic  Name:  Amy Greer    MRN: [id number] DOB: May 13, 1956  11/18/2019  Amy Greer was observed post Covid-19 immunization for 15 minutes without incident. She was provided with Vaccine Information Sheet and instruction to access the V-Safe system.   Amy Greer was instructed to call 911 with any severe reactions post vaccine: Marland Kitchen Difficulty breathing  . Swelling of face and throat  . A fast heartbeat  . A bad rash all over body  . Dizziness and weakness   Immunizations Administered    Name Date Dose VIS Date Route   Moderna COVID-19 Vaccine 11/18/2019 12:11 PM 0.5 mL 07/29/2019 Intramuscular   Manufacturer: Moderna   Lot: [id number]   NDC: 63785-885-02

## 2020-02-09 ENCOUNTER — Encounter: Payer: BC Managed Care – PPO | Admitting: Nurse Practitioner

## 2020-03-09 ENCOUNTER — Other Ambulatory Visit: Payer: Self-pay

## 2020-03-09 ENCOUNTER — Ambulatory Visit (INDEPENDENT_AMBULATORY_CARE_PROVIDER_SITE_OTHER): Payer: PRIVATE HEALTH INSURANCE | Admitting: Nurse Practitioner

## 2020-03-09 ENCOUNTER — Encounter: Payer: PRIVATE HEALTH INSURANCE | Admitting: Nurse Practitioner

## 2020-03-09 ENCOUNTER — Encounter: Payer: Self-pay | Admitting: Nurse Practitioner

## 2020-03-09 VITALS — BP 132/80 | HR 72 | Temp 97.7°F | Ht 65.0 in | Wt 202.4 lb

## 2020-03-09 DIAGNOSIS — E6609 Other obesity due to excess calories: Secondary | ICD-10-CM | POA: Diagnosis not present

## 2020-03-09 DIAGNOSIS — M79601 Pain in right arm: Secondary | ICD-10-CM | POA: Diagnosis not present

## 2020-03-09 DIAGNOSIS — R7309 Other abnormal glucose: Secondary | ICD-10-CM | POA: Diagnosis not present

## 2020-03-09 DIAGNOSIS — Z6833 Body mass index (BMI) 33.0-33.9, adult: Secondary | ICD-10-CM

## 2020-03-09 MED ORDER — PHENTERMINE HCL 15 MG PO CAPS: 15.0000 mg | ORAL_CAPSULE | ORAL | 1 refills | Status: DC

## 2020-03-09 MED ORDER — PREDNISONE 10 MG (21) PO TBPK: ORAL_TABLET | ORAL | 0 refills | Status: DC

## 2020-03-09 NOTE — Patient Instructions (Signed)
.   Goal to exercise to walk 30 minutes a day 3 days a week and drink 1/2 gallon to 1 gallon per day. . Encouraged to park further when at the store, take stairs instead of elevators and to walk in place during commercials. . Increase water intake to at least one gallon of water daily.

## 2020-03-09 NOTE — Progress Notes (Signed)
I,Dorethia Jeanmarie [employer] as a Education administrator for [employer], FNP.,have documented all relevant documentation on the behalf of Minette Brine, FNP,as directed by  Minette Brine, FNP while in the presence of Minette Brine, [place].  This visit occurred during the SARS-CoV-2 public health emergency.  Safety protocols were in place, including screening questions prior to the visit, additional usage of staff PPE, and extensive cleaning of exam room while observing appropriate contact time as indicated for disinfecting solutions.  Subjective:     Patient ID: Amy Greer , female    DOB: [birth year] , 64 y.o.   MRN: [id number]   Chief Complaint  Patient presents with  . Weight Check  . Arm Pain    patient stated her arm has been hurting since receiving her covid vaccine    HPI  Pt here for weight check she has not been seen since June 2020.  She does admit to not being very active.  She reports she doesn't exercise much, she does have a stepper at home that she just purchased and plans on using, she is also increasing her water intake, right now she is trying to drink at least 3 bottles a day. She does report she has a Dietitian at home. She has recently started a new job today so she will be moving around more.   Pt stated she doesn't eat breakfast maybe some fruit sometimes. She tries to get one good meal in a day, usually dinner nothing too heavy and before 7:00. For lunch she would typically have a sandwich on bread or crackers. Pt stated she doesn't eat a lot of snacks, but she does eat ice cream at night before bed. Encourage pt to cut ice cream off at night before bed.  Pt drinks a glass of wine every night before bed. Encourage pt to log her calorie intake. Pt should be checked for sleep apnea.   Has never taken medication for weight loss. Pt would like to start taking phentermine to see if that would work before doing something else.   Wt Readings from Last 3 Encounters: 03/09/20 :  202 lb 6.4 oz (91.8 kg) 02/03/19 : 204 lb (92.5 kg) 11/05/16 : 188 lb (85.3 kg)   Arm Pain  The incident occurred more than 1 week ago (3 months ). Incident location: after COVID injection  Injury mechanism: Same arm she had her covid vaccine. Pain location: Right deltoid  The quality of the pain is described as aching. The pain does not radiate. The pain is at a severity of 6/10. The pain is moderate. The pain has been intermittent (worse at night ) since the incident. Pertinent negatives include no numbness or tingling. The symptoms are aggravated by lifting and movement. She has tried acetaminophen and NSAIDs (pain patch) for the symptoms. The treatment provided no relief.     Past Medical History:  Diagnosis Date  . GERD (gastroesophageal reflux disease)   . Lower extremity edema      Family History  Problem Relation Age of Onset  . Kidney disease Mother   . Hyperlipidemia Mother   . Lung cancer Sister   . Lung cancer Brother      Current Outpatient Medications:  .  Ascorbic Acid (VITAMIN C) 1000 MG tablet, Take 1,000 mg by mouth as directed. 1-2 TABLETS DAILY, Disp: , Rfl:  .  Cholecalciferol (VITAMIN D3) 1000 units CAPS, Take by mouth as directed. 2-3 BY MOUTH DAILY, Disp: , Rfl:  .  furosemide (LASIX)  20 MG tablet, Take 1 tablet (20 mg total) by mouth daily as needed for edema. (Patient not taking: Reported on 03/09/2020), Disp: 30 tablet, Rfl: 1 .  phentermine 15 MG capsule, Take 1 capsule (15 mg total) by mouth every morning., Disp: 30 capsule, Rfl: 1 .  predniSONE (STERAPRED UNI-PAK 21 TAB) 10 MG (21) TBPK tablet, Take as directed, Disp: 21 tablet, Rfl: 0   Allergies  Allergen Reactions  . Penicillins     SWELLING OF THE THROAT      Review of Systems  Constitutional: Negative.  Negative for fatigue.  Eyes: Negative.   Respiratory: Negative.   Cardiovascular: Negative.   Endocrine: Negative for polydipsia, polyphagia and polyuria.  Musculoskeletal: Positive for  myalgias (Right deltoid pain ).  Neurological: Negative for dizziness, tingling, numbness and headaches.  Hematological: Negative for adenopathy.  Psychiatric/Behavioral: Negative.      Today's Vitals   03/09/20 1518  BP: 132/80  Pulse: 72  Temp: 97.7 F (36.5 C)  TempSrc: Oral  Weight: 202 lb 6.4 oz (91.8 kg)  Height: '5\' 5"'  (1.651 m)  PainSc: 0-No pain   Body mass index is 33.68 kg/m.   Objective:  Physical Exam Vitals reviewed.  Constitutional:      General: She is not in acute distress.    Appearance: Normal appearance. She is obese.  Cardiovascular:     Rate and Rhythm: Normal rate.     Pulses: Normal pulses.     Heart sounds: Normal heart sounds. No murmur heard.   Pulmonary:     Effort: Pulmonary effort is normal. No respiratory distress.     Breath sounds: Normal breath sounds.  Musculoskeletal:        General: Tenderness (Right deltoid ) present. No swelling, deformity or signs of injury. Normal range of motion.  Neurological:     General: No focal deficit present.     Mental Status: She is alert and oriented to person, place, and time.     Cranial Nerves: No cranial nerve deficit.  Psychiatric:        Mood and Affect: Mood normal.        Behavior: Behavior normal.        Thought Content: Thought content normal.        Judgment: Judgment normal.         Assessment And Plan:     1. Right arm pain  Pain to right deltoid has good range of motion  Bursitis vs inflamation  Will treat with prednisone - predniSONE (STERAPRED UNI-PAK 21 TAB) 10 MG (21) TBPK tablet; Take as directed  Dispense: 21 tablet; Refill: 0  2. Class 1 obesity due to excess calories without serious comorbidity with body mass index (BMI) of 33.0 to 33.9 in adult ASK- Given permission to discuss weight today ASSESS - Body mass index is 33.68 kg/m., well tolerated, WAIST CIRCUMFERENCE 45.5 cm ADVISE  - on risk of cardiovascular disease currently has a diagnosis of hypertension, on  medications. AGREE - to increase her physical activity of walking to 30 minutes per day 3 days a week and will drink at least 1/2 gallon to 1 gallon water a day. Goal to lose 10 lbs by next visit. ASSIST -phentermine started and discussed side affects. feels barriers have been related to [place] pandemic  She is encouraged to avoid eating ice cream at night before bed. - phentermine 15 MG capsule; Take 1 capsule (15 mg total) by mouth every morning.  Dispense: 30 capsule; Refill: 1 -  BMP8+eGFR - Hemoglobin A1c - EKG 12-Lead  3. Abnormal glucose  Diet controlled  Will check HgbA1c  Encouraged to avoid sugary foods and drinks - BMP8+eGFR - Hemoglobin A1c       Hassell Done, CMA   I, Hassell Done, CMA, have reviewed all documentation for this visit. The documentation on 03/09/20 for the exam, diagnosis, procedures, and orders are all accurate and complete.  THE PATIENT IS ENCOURAGED TO PRACTICE SOCIAL DISTANCING DUE TO THE COVID-19 PANDEMIC.

## 2020-03-10 ENCOUNTER — Encounter: Payer: Self-pay | Admitting: Nurse Practitioner

## 2020-03-10 LAB — HEMOGLOBIN A1C
Est. average glucose Bld gHb Est-mCnc: 117 mg/dL
Hgb A1c MFr Bld: 5.7 % — ABNORMAL HIGH (ref 4.8–5.6)

## 2020-03-10 LAB — BMP8+EGFR
BUN/Creatinine Ratio: 12 (ref 12–28)
BUN: 11 mg/dL (ref 8–27)
CO2: 27 mmol/L (ref 20–29)
Calcium: 9.4 mg/dL (ref 8.7–10.3)
Chloride: 102 mmol/L (ref 96–106)
Creatinine, Ser: 0.92 mg/dL (ref 0.57–1.00)
GFR calc Af Amer: 77 mL/min/{1.73_m2} (ref >59)
GFR calc non Af Amer: 67 mL/min/{1.73_m2} (ref >59)
Glucose: 76 mg/dL (ref 65–99)
Potassium: 3.8 mmol/L (ref 3.5–5.2)
Sodium: 142 mmol/L (ref 134–144)

## 2020-05-11 ENCOUNTER — Ambulatory Visit: Payer: PRIVATE HEALTH INSURANCE | Admitting: Internal Medicine

## 2020-09-09 ENCOUNTER — Encounter: Payer: Self-pay | Admitting: Internal Medicine

## 2020-09-09 ENCOUNTER — Ambulatory Visit: Payer: BC Managed Care – PPO | Admitting: Internal Medicine

## 2020-09-09 ENCOUNTER — Other Ambulatory Visit: Payer: Self-pay

## 2020-09-09 VITALS — BP 114/62 | HR 72 | Temp 98.0°F | Ht 65.0 in | Wt 200.8 lb

## 2020-09-09 DIAGNOSIS — E6609 Other obesity due to excess calories: Secondary | ICD-10-CM

## 2020-09-09 DIAGNOSIS — M79601 Pain in right arm: Secondary | ICD-10-CM | POA: Diagnosis not present

## 2020-09-09 DIAGNOSIS — Z6833 Body mass index (BMI) 33.0-33.9, adult: Secondary | ICD-10-CM

## 2020-09-09 NOTE — Patient Instructions (Signed)
Ginger turmeric tea Vicks Vaporub to affected areas    Rotator Cuff Tendinitis  Rotator cuff tendinitis is inflammation of the tendons in the rotator cuff. Tendons are tough, cord-like bands that connect muscle to bone. The rotator cuff includes all of the muscles and tendons that connect the arm to the shoulder. The rotator cuff holds the head of the humerus, or the upper arm bone, in the cup of the shoulder blade (scapula). This condition can lead to a long-term or chronic tear. The tear may be partial or complete. What are the causes? This condition is usually caused by overusing the rotator cuff. What increases the risk? This condition is more likely to develop in athletes and workers who frequently use their shoulder or reach over their heads. This can include activities such as:  Tennis.  Baseball or softball.  Swimming.  Construction work.  Painting. What are the signs or symptoms? Symptoms of this condition include:  Pain that spreads (radiates) from the shoulder to the upper arm.  Swelling and tenderness in front of the shoulder.  Pain when reaching, pulling, or lifting the arm above the head.  Pain when lowering the arm from above the head.  Minor pain in the shoulder when resting.  Increased pain in the shoulder at night.  Difficulty placing the arm behind the back. How is this diagnosed? This condition is diagnosed with a physical exam and medical history. Tests may also be done, including:  X-rays.  MRI.  Ultrasound.  CT with or without contrast. How is this treated? Treatment for this condition depends on the severity of the condition. In less severe cases, treatment may include:  Rest. This may be done with a sling that holds the shoulder still (immobilization). Your health care provider may also recommend avoiding activities that involve lifting your arm over your head.  Icing the shoulder.  Anti-inflammatory medicines, such as aspirin or  ibuprofen. In more severe cases, treatment may include:  Physical therapy.  Steroid injections.  Surgery. Follow these instructions at home: If you have a sling:  Wear the sling as told by your health care provider. Remove it only as told by your health care provider.  Loosen it if your fingers tingle, become numb, or turn cold and blue.  Keep it clean.  If the sling is not waterproof: ? Do not let it get wet. ? Cover it with a watertight covering when you take a bath or shower. Managing pain, stiffness, and swelling  If directed, put ice on the injured area. To do this: ? If you have a removable sling, remove it as told by your health care provider. ? Put ice in a plastic bag. ? Place a towel between your skin and the bag. ? Leave the ice on for 20 minutes, 2-3 times a day.  Move your fingers often to reduce stiffness and swelling.  Raise (elevate) the injured area above the level of your heart while you are lying down.  Find a comfortable sleeping position, or sleep in a recliner, if available.   Activity  Rest your shoulder as told by your health care provider.  Ask your health care provider when it is safe to drive if you have a sling on your arm.  Return to your normal activities as told by your health care provider. Ask your health care provider what activities are safe for you.  Do any exercises or stretches as told by your health care provider or physical therapist.  If you  do repetitive overhead tasks, take small breaks in between and include stretching exercises as told by your health care provider. General instructions  Do not use any products that contain nicotine or tobacco, such as cigarettes, e-cigarettes, and chewing tobacco. These can delay healing. If you need help quitting, ask your health care provider.  Take over-the-counter and prescription medicines only as told by your health care provider.  Keep all follow-up visits as told by your health  care provider. This is important. Contact a health care provider if:  Your pain gets worse.  You have new pain in your arm, hands, or fingers.  Your pain is not relieved with medicine or does not get better after 6 weeks of treatment.  You have crackling sensations when moving your shoulder in certain directions.  You hear a snapping sound after using your shoulder, followed by severe pain and weakness. Get help right away if:  Your arm, hand, or fingers are numb or tingling.  Your arm, hand, or fingers are swollen or painful or they turn white or blue. Summary  Rotator cuff tendinitis is inflammation of the tendons in the rotator cuff. Tendons are tough, cord-like bands that connect muscle to bone.  This condition is usually caused by overusing the rotator cuff, which includes all of the muscles and tendons that connect the arm to the shoulder.  This condition is more likely to develop in athletes and workers who frequently use their shoulder or reach over their heads.  Treatment generally includes rest, anti-inflammatory medicines, and icing. In some cases, physical therapy and steroid injections may be needed. In severe cases, surgery may be needed. This information is not intended to replace advice given to you by your health care provider. Make sure you discuss any questions you have with your health care provider. Document Revised: 05/19/2019 Document Reviewed: 05/19/2019 Elsevier Patient Education  2021 [employer].

## 2020-09-09 NOTE — Progress Notes (Signed)
I,Katawbba Wiggins,acting as a Neurosurgeon for Gwynneth Aliment, MD.,have documented all relevant documentation on the behalf of Gwynneth Aliment, MD,as directed by  Gwynneth Aliment, MD while in the presence of Gwynneth Aliment, MD.  This visit occurred during the SARS-CoV-2 public health emergency.  Safety protocols were in place, including screening questions prior to the visit, additional usage of staff PPE, and extensive cleaning of exam room while observing appropriate contact time as indicated for disinfecting solutions.  Subjective:     Patient ID: Amy Greer , female    DOB: [birth year] , 65 y.o.   MRN: [id number]   Chief Complaint  Patient presents with   Shoulder Pain    right    HPI  The patient is here today for right arm/shoulder pain.  She denies fall; however, she reports her sx started after getting her COVID vaccine. She has been prescribed prednisone in the past without any relief of her sx. Denies RUE weakness/paresthesias.    Arm Pain  The incident occurred more than 1 week ago (3 months ). Incident location: after COVID injection  Injury mechanism: Same arm she had her covid vaccine. The pain is present in the right shoulder (Right deltoid ). The quality of the pain is described as aching. The pain does not radiate. The pain is at a severity of 6/10. The pain is moderate. The pain has been intermittent (worse at night ) since the incident. Pertinent negatives include no numbness or tingling. The symptoms are aggravated by lifting and movement. She has tried acetaminophen and NSAIDs (pain patch) for the symptoms. The treatment provided no relief.     Past Medical History:  Diagnosis Date   GERD (gastroesophageal reflux disease)    Lower extremity edema      Family History  Problem Relation Age of Onset   Kidney disease Mother    Hyperlipidemia Mother    Lung cancer Sister    Lung cancer Brother    Ulcers Father      Current Outpatient Medications:     Ascorbic Acid (VITAMIN C) 1000 MG tablet, Take 1,000 mg by mouth as directed. 1-2 TABLETS DAILY, Disp: , Rfl:    Cholecalciferol (VITAMIN D3) 1000 units CAPS, Take by mouth as directed. 2-3 BY MOUTH DAILY, Disp: , Rfl:    omeprazole (PRILOSEC) 10 MG capsule, Take 10 mg by mouth daily. AS NEEDED, Disp: , Rfl:    Allergies  Allergen Reactions   Penicillins     SWELLING OF THE THROAT      Review of Systems  Constitutional: Negative.   Respiratory: Negative.   Cardiovascular: Negative.   Gastrointestinal: Negative.   Musculoskeletal:       Right shoulder pain  Neurological: Negative for tingling and numbness.  Psychiatric/Behavioral: Negative.   All other systems reviewed and are negative.    Today's Vitals   09/09/20 1537  BP: 114/62  Pulse: 72  Temp: 98 F (36.7 C)  TempSrc: Oral  Weight: 200 lb 12.8 oz (91.1 kg)  Height: 5\' 5"  (1.651 m)  PainSc: 3   PainLoc: Shoulder   Body mass index is 33.41 kg/m.  Wt Readings from Last 3 Encounters:  09/09/20 200 lb 12.8 oz (91.1 kg)  03/09/20 202 lb 6.4 oz (91.8 kg)  02/03/19 204 lb (92.5 kg)   Objective:  Physical Exam Vitals and nursing note reviewed.  Constitutional:      Appearance: Normal appearance. She is obese.  HENT:     Head:  Normocephalic and atraumatic.  Cardiovascular:     Rate and Rhythm: Normal rate and regular rhythm.     Heart sounds: Normal heart sounds.  Pulmonary:     Effort: Pulmonary effort is normal.     Breath sounds: Normal breath sounds.  Musculoskeletal:        General: Tenderness present.     Comments: R deltoid tenderness  Skin:    General: Skin is warm.  Neurological:     General: No focal deficit present.     Mental Status: She is alert.  Psychiatric:        Mood and Affect: Mood normal.        Behavior: Behavior normal.         Assessment And Plan:     1. Right arm pain Comments: I will refer her to Ortho for further evaluation/radiographic studies. She is agreeable to  referral. Advised to also apply Voltaren gel to affected area tid prn - Ambulatory referral to Orthopedic Surgery  2. Class 1 obesity due to excess calories without serious comorbidity with body mass index (BMI) of 33.0 to 33.9 in adult Comments: She is encouraged to strive for BMI less than 30 to decrease caridac risk. Advisd to aim for at least 150 minutes per week.   Patient was given opportunity to ask questions. Patient verbalized understanding of the plan and was able to repeat key elements of the plan. All questions were answered to their satisfaction.  Gwynneth Aliment, MD   I, Gwynneth Aliment, MD, have reviewed all documentation for this visit. The documentation on 09/17/20 for the exam, diagnosis, procedures, and orders are all accurate and complete.  THE PATIENT IS ENCOURAGED TO PRACTICE SOCIAL DISTANCING DUE TO THE COVID-19 PANDEMIC.

## 2020-09-14 ENCOUNTER — Other Ambulatory Visit: Payer: Self-pay

## 2020-09-15 ENCOUNTER — Other Ambulatory Visit: Payer: Self-pay

## 2020-09-16 ENCOUNTER — Other Ambulatory Visit: Payer: BC Managed Care – PPO

## 2020-09-16 DIAGNOSIS — Z20822 Contact with and (suspected) exposure to covid-19: Secondary | ICD-10-CM

## 2020-09-18 LAB — SARS-COV-2, NAA 2 DAY TAT

## 2020-09-18 LAB — NOVEL CORONAVIRUS, NAA: SARS-CoV-2, NAA: NOT DETECTED

## 2020-09-20 ENCOUNTER — Ambulatory Visit: Payer: Self-pay

## 2020-09-20 ENCOUNTER — Ambulatory Visit (INDEPENDENT_AMBULATORY_CARE_PROVIDER_SITE_OTHER): Payer: BC Managed Care – PPO | Admitting: Orthopedic Surgery

## 2020-09-20 DIAGNOSIS — M79601 Pain in right arm: Secondary | ICD-10-CM

## 2020-09-20 DIAGNOSIS — M792 Neuralgia and neuritis, unspecified: Secondary | ICD-10-CM | POA: Diagnosis not present

## 2020-09-20 DIAGNOSIS — M25511 Pain in right shoulder: Secondary | ICD-10-CM | POA: Diagnosis not present

## 2020-09-25 ENCOUNTER — Encounter: Payer: Self-pay | Admitting: Orthopedic Surgery

## 2020-09-25 NOTE — Progress Notes (Unsigned)
Office Visit Note   Patient: Amy Greer           Date of Birth: [birth year]           MRN: [id number] Visit Date: 09/20/2020 Requested by: Dorothyann Peng, MD [address] [place],  [place] [address] PCP: Dorothyann Peng, MD  Subjective: Chief Complaint  Patient presents with  . Right Shoulder - Pain  . Neck - Pain    HPI: MARKELA WEE is a 65 y.o. female who presents to the office complaining of right arm and shoulder pain.  Patient complains of pain since receiving her second Covid vaccine in the right arm back in April 2021.  She complains of lateral shoulder pain that radiates into her trapezius and down into her hand.  She also complains of neck pain.  She has occasional numbness and tingling in her hand.  She feels her symptoms are worsening.  Pain wakes her up at night.  She does have to assist her arm when lifting her arm up above shoulder level at times.  She has no previous neck or shoulder surgery.  Denies any previous shoulder issue or any previous neck pain.  She has occasional left-sided symptoms but nothing consistent as she feels in the right arm..                ROS: All systems reviewed are negative as they relate to the chief complaint within the history of present illness.  Patient denies fevers or chills.  Assessment & Plan: Visit Diagnoses:  1. Radicular pain in right arm     Plan: Patient is a 65 year old female who presents complaining of right arm pain.  She has had pain that she feels came on after her second Covid vaccine in the right arm.  This was in April 2021.  She feels her symptoms are worsening.  She complains of lateral shoulder pain that radiates down into her hand and into her trapezius muscle.  She also complains of neck pain.  She does have decreased range of motion of the right shoulder compared with the contralateral shoulder but her symptoms seem to be mostly radicular by her history.  Differential diagnosis includes right  radiculopathy from the cervical spine versus adhesive capsulitis of the right shoulder or combination of both pathologies.  She is not interested in an injection in her shoulder today and instead would like to definitively know what is going on.  With longstanding nature of her symptoms and the radicular nature, ordered MRI of the cervical spine for further evaluation.  Follow-up after MRI to review results.  If MRI is negative, plan to discuss glenohumeral injection at next visit.  Patient agreed with this plan.  Follow-Up Instructions: No follow-ups on file.   Orders:  Orders Placed This Encounter  Procedures  . XR Shoulder Right  . XR Cervical Spine 2 or 3 views  . MR Cervical Spine w/o contrast   No orders of the defined types were placed in this encounter.     Procedures: No procedures performed   Clinical Data: No additional findings.  Objective: Vital Signs: There were no vitals taken for this visit.  Physical Exam:  Constitutional: Patient appears well-developed HEENT:  Head: Normocephalic Eyes:EOM are normal Neck: Normal range of motion Cardiovascular: Normal rate Pulmonary/chest: Effort normal Neurologic: Patient is alert Skin: Skin is warm Psychiatric: Patient has normal mood and affect  Ortho Exam: Ortho exam demonstrates right shoulder with 30 degrees external rotation,  90 degrees abduction, 160 degrees forward flexion.  She also has decreased internal rotation of the right shoulder.  She has a little bit of slight weakness with external rotation and pain with external rotation of the right shoulder.  Left shoulder with 60 degrees external rotation, 135 degrees abduction, 160 degrees forward flexion.  Excellent strength of supraspinatus and subscapularis on exam today with the right shoulder.  Tenderness throughout the axial cervical spine.  Positive Spurling sign.  Also 5 motor strength of bilateral grip strength, finger abduction, pronation/supination, bicep,  tricep, deltoid.  Sensation intact through all dermatomes of the bilateral upper extremities.  Specialty Comments:  No specialty comments available.  Imaging: No results found.   PMFS History: Patient Active Problem List   Diagnosis Date Noted  . Back spasm 11/27/2018  . GERD (gastroesophageal reflux disease)   . Lower extremity edema   . 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 GERD, SEVERE 05/02/2008  . LUMP OR MASS IN BREAST 05/02/2008  . DYSPNEA 04/27/2008  . Nonspecific (abnormal) findings on radiological and other examination of body structure 04/24/2008  . NONSPECIFIC ABNORM FIND RAD&OTH EXAM LUNG FIELD 04/24/2008   Past Medical History:  Diagnosis Date  . GERD (gastroesophageal reflux disease)   . Lower extremity edema     Family History  Problem Relation Age of Onset  . Kidney disease Mother   . Hyperlipidemia Mother   . Lung cancer Sister   . Lung cancer Brother   . Ulcers Father     No past surgical history on file. Social History   Occupational History  . Not on file  Tobacco Use  . Smoking status: Former Smoker    Years: 14.00    Types: Cigarettes  . Smokeless tobacco: Never Used  . Tobacco comment: 1 year ago  Vaping Use  . Vaping Use: Never used  Substance and Sexual Activity  . Alcohol use: Not on file    Comment: SOCIAL  . Drug use: Never  . Sexual activity: Not on file

## 2020-09-26 ENCOUNTER — Encounter: Payer: Self-pay | Admitting: Orthopedic Surgery

## 2020-10-03 ENCOUNTER — Ambulatory Visit
Admission: RE | Admit: 2020-10-03 | Discharge: 2020-10-03 | Disposition: A | Discharge status: Home / Self Care | Payer: BC Managed Care – PPO | Source: Ambulatory Visit | Attending: Orthopedic Surgery | Admitting: Orthopedic Surgery

## 2020-10-03 ENCOUNTER — Other Ambulatory Visit: Payer: Self-pay

## 2020-10-03 ENCOUNTER — Other Ambulatory Visit: Payer: BC Managed Care – PPO

## 2020-10-03 DIAGNOSIS — M792 Neuralgia and neuritis, unspecified: Secondary | ICD-10-CM

## 2020-10-03 DIAGNOSIS — M50222 Other cervical disc displacement at C5-C6 level: Secondary | ICD-10-CM | POA: Diagnosis not present

## 2020-10-03 DIAGNOSIS — M50223 Other cervical disc displacement at C6-C7 level: Secondary | ICD-10-CM | POA: Diagnosis not present

## 2020-10-03 DIAGNOSIS — M47812 Spondylosis without myelopathy or radiculopathy, cervical region: Secondary | ICD-10-CM | POA: Diagnosis not present

## 2020-10-03 DIAGNOSIS — M50221 Other cervical disc displacement at C4-C5 level: Secondary | ICD-10-CM | POA: Diagnosis not present

## 2020-10-07 ENCOUNTER — Encounter: Payer: Self-pay | Admitting: Internal Medicine

## 2020-10-07 ENCOUNTER — Ambulatory Visit (INDEPENDENT_AMBULATORY_CARE_PROVIDER_SITE_OTHER): Payer: PRIVATE HEALTH INSURANCE | Admitting: Internal Medicine

## 2020-10-07 ENCOUNTER — Other Ambulatory Visit: Payer: Self-pay

## 2020-10-07 VITALS — BP 116/68 | HR 68 | Temp 98.0°F | Ht 65.0 in | Wt 192.2 lb

## 2020-10-07 DIAGNOSIS — Z6833 Body mass index (BMI) 33.0-33.9, adult: Secondary | ICD-10-CM

## 2020-10-07 DIAGNOSIS — E6609 Other obesity due to excess calories: Secondary | ICD-10-CM

## 2020-10-07 DIAGNOSIS — L258 Unspecified contact dermatitis due to other agents: Secondary | ICD-10-CM | POA: Diagnosis not present

## 2020-10-07 DIAGNOSIS — L821 Other seborrheic keratosis: Secondary | ICD-10-CM

## 2020-10-07 DIAGNOSIS — K5901 Slow transit constipation: Secondary | ICD-10-CM | POA: Diagnosis not present

## 2020-10-07 DIAGNOSIS — Z1231 Encounter for screening mammogram for malignant neoplasm of breast: Secondary | ICD-10-CM

## 2020-10-07 DIAGNOSIS — R7309 Other abnormal glucose: Secondary | ICD-10-CM | POA: Diagnosis not present

## 2020-10-07 MED ORDER — WEGOVY 0.25 MG/0.5ML ~~LOC~~ SOAJ: 0.2500 mg | SUBCUTANEOUS | 1 refills | Status: DC

## 2020-10-07 NOTE — Progress Notes (Signed)
Rutherford Nail as a scribe for Maximino Greenland, MD.,have documented all relevant documentation on the behalf of Maximino Greenland, MD,as directed by  Maximino Greenland, MD while in the presence of Maximino Greenland, MD. This visit occurred during the SARS-CoV-2 public health emergency.  Safety protocols were in place, including screening questions prior to the visit, additional usage of staff PPE, and extensive cleaning of exam room while observing appropriate contact time as indicated for disinfecting solutions.  Subjective:     Patient ID: Amy Greer , female    DOB: 06/02/1956 , 65 y.o.   MRN: [id number]   Chief Complaint  Patient presents with  . Obesity    HPI  Pt here for f/u on wegovy. Pt states after wegovy she feels a little nervousness at times, but after the third day she's fine. She reports feeling nauseated as well. She wants to continue with the medication; however, she does not want to increase the dose.     Past Medical History:  Diagnosis Date  . GERD (gastroesophageal reflux disease)   . Lower extremity edema      Family History  Problem Relation Age of Onset  . Kidney disease Mother   . Hyperlipidemia Mother   . Lung cancer Sister   . Lung cancer Brother   . Ulcers Father      Current Outpatient Medications:  .  Ascorbic Acid (VITAMIN C) 1000 MG tablet, Take 1,000 mg by mouth as directed. 1-2 TABLETS DAILY, Disp: , Rfl:  .  Cholecalciferol (VITAMIN D3) 1000 units CAPS, Take by mouth as directed. 2-3 BY MOUTH DAILY, Disp: , Rfl:  .  triamcinolone (KENALOG) 0.1 %, APPLY TO AFFECTED AREA TWICE DAILY AS NEEDED, Disp: 45 g, Rfl: 0 .  omeprazole (PRILOSEC) 10 MG capsule, Take 10 mg by mouth daily. AS NEEDED (Patient not taking: Reported on 10/07/2020), Disp: , Rfl:  .  Semaglutide-Weight Management (WEGOVY) 0.25 MG/0.5ML SOAJ, Inject 0.25 mg into the skin once a week., Disp: 2 mL, Rfl: 1   Allergies  Allergen Reactions  . Penicillins     SWELLING  OF THE THROAT      Review of Systems  Constitutional: Negative.  Negative for fatigue.  HENT: Negative.   Respiratory: Negative.   Cardiovascular: Negative.   Gastrointestinal: Positive for constipation.  Endocrine: Negative for polydipsia, polyphagia and polyuria.  Musculoskeletal: Negative.   Skin: Positive for color change.       Dark spot on (L) wrist --first noticed it about a month or so ago. Denies itching. Denies scaling.   Neurological: Negative for dizziness and headaches.  Psychiatric/Behavioral: Negative.      Today's Vitals   10/07/20 1509  BP: 116/68  Pulse: 68  Temp: 98 F (36.7 C)  TempSrc: Oral  Weight: 192 lb 3.2 oz (87.2 kg)  Height: 5' 5"  (1.651 m)   Body mass index is 31.98 kg/m.  Wt Readings from Last 3 Encounters:  10/07/20 192 lb 3.2 oz (87.2 kg)  09/09/20 200 lb 12.8 oz (91.1 kg)  03/09/20 202 lb 6.4 oz (91.8 kg)   Objective:  Physical Exam Vitals and nursing note reviewed.  Constitutional:      Appearance: Normal appearance.  HENT:     Head: Normocephalic and atraumatic.  Cardiovascular:     Rate and Rhythm: Normal rate and regular rhythm.     Heart sounds: Normal heart sounds.  Pulmonary:     Effort: Pulmonary effort is normal.  Breath sounds: Normal breath sounds.  Skin:    General: Skin is warm.     Comments: Hyperpigmented, raised lesion on right side of neck. No vesicular lesions onted. Also with scaly rash with some maculopapular lesions on left wrist.   Neurological:     General: No focal deficit present.     Mental Status: She is alert.  Psychiatric:        Mood and Affect: Mood normal.        Behavior: Behavior normal.         Assessment And Plan:     1. Class 1 obesity due to excess calories without serious comorbidity with body mass index (BMI) of 31.98 to 33.9 in adult Comments: She was congratulated on her 8 pound weight loss. She elects to c/w 0.11m Wegovy. She will f/u in 8 weeks for re-evaluation. Advised to  exercise 150 min/week.   2. Contact dermatitis due to jewelry Comments: I will send in rx triamcinolone cream to affected area as needed. She is advised to stop wearing bracelet until this has resolved.   3. Abnormal glucose Comments: Her a1c has been elevated in the past. I will recheck this today. She is encouraged to avoid sugary beverages.  - CMP14+EGFR - Hemoglobin A1c  4. Slow transit constipation Comments: Likely exacerbated by [id number] She is encouraged to increase her daily actiivty and water intake. She will let me know if her sx persist.  - TSH  5. Dermatosis papulosa nigra Comments: Located on her neck,they are getting caught in her jewelry. I will refer her to Derm for excision.   6. Encounter for screening mammogram for malignant neoplasm of breast Comments: I will refer her to [facility] as requested.She is encouraged to perform monthly self breast exams.  - MM Digital Screening; Future   Patient was given opportunity to ask questions. Patient verbalized understanding of the plan and was able to repeat key elements of the plan. All questions were answered to their satisfaction.   I, RMaximino Greenland MD, have reviewed all documentation for this visit. The documentation on 10/17/20 for the exam, diagnosis, procedures, and orders are all accurate and complete.  THE PATIENT IS ENCOURAGED TO PRACTICE SOCIAL DISTANCING DUE TO THE COVID-19 PANDEMIC.

## 2020-10-07 NOTE — Patient Instructions (Addendum)
Chia seeds, 1 tbsp in 8-10 ounces of water with juice of 1/2 lemon   Exercising to Lose Weight Exercise is structured, repetitive physical activity to improve fitness and health. Getting regular exercise is important for everyone. It is especially important if you are overweight. Being overweight increases your risk of heart disease, stroke, diabetes, high blood pressure, and several types of cancer. Reducing your calorie intake and exercising can help you lose weight. Exercise is usually categorized as moderate or vigorous intensity. To lose weight, most people need to do a certain amount of moderate-intensity or vigorous-intensity exercise each week. Moderate-intensity exercise Moderate-intensity exercise is any activity that gets you moving enough to burn at least three times more energy (calories) than if you were sitting. Examples of moderate exercise include:  Walking a mile in 15 minutes.  Doing light yard work.  Biking at an easy pace. Most people should get at least 150 minutes (2 hours and 30 minutes) a week of moderate-intensity exercise to maintain their body weight.   Vigorous-intensity exercise Vigorous-intensity exercise is any activity that gets you moving enough to burn at least six times more calories than if you were sitting. When you exercise at this intensity, you should be working hard enough that you are not able to carry on a conversation. Examples of vigorous exercise include:  Running.  Playing a team sport, such as football, basketball, and soccer.  Jumping rope. Most people should get at least 75 minutes (1 hour and 15 minutes) a week of vigorous-intensity exercise to maintain their body weight. How can exercise affect me? When you exercise enough to burn more calories than you eat, you lose weight. Exercise also reduces body fat and builds muscle. The more muscle you have, the more calories you burn. Exercise also:  Improves mood.  Reduces stress and  tension.  Improves your overall fitness, flexibility, and endurance.  Increases bone strength. The amount of exercise you need to lose weight depends on:  Your age.  The type of exercise.  Any health conditions you have.  Your overall physical ability. Talk to your health care provider about how much exercise you need and what types of activities are safe for you. What actions can I take to lose weight? Nutrition  Make changes to your diet as told by your health care provider or diet and nutrition specialist (dietitian). This may include: ? Eating fewer calories. ? Eating more protein. ? Eating less unhealthy fats. ? Eating a diet that includes fresh fruits and vegetables, whole grains, low-fat dairy products, and lean protein. ? Avoiding foods with added fat, salt, and sugar.  Drink plenty of water while you exercise to prevent dehydration or heat stroke.   Activity  Choose an activity that you enjoy and set realistic goals. Your health care provider can help you make an exercise plan that works for you.  Exercise at a moderate or vigorous intensity most days of the week. ? The intensity of exercise may vary from person to person. You can tell how intense a workout is for you by paying attention to your breathing and heartbeat. Most people will notice their breathing and heartbeat get faster with more intense exercise.  Do resistance training twice each week, such as: ? Push-ups. ? Sit-ups. ? Lifting weights. ? Using resistance bands.  Getting short amounts of exercise can be just as helpful as long structured periods of exercise. If you have trouble finding time to exercise, try to include exercise in your daily  routine. ? Get up, stretch, and walk around every 30 minutes throughout the day. ? Go for a walk during your lunch break. ? Park your car farther away from your destination. ? If you take public transportation, get off one stop early and walk the rest of the  way. ? Make phone calls while standing up and walking around. ? Take the stairs instead of elevators or escalators.  Wear comfortable clothes and shoes with good support.  Do not exercise so much that you hurt yourself, feel dizzy, or get very short of breath. Where to find more information  [place]. Department of Health and Human Services: ThisPath.fi  Centers for Disease Control and Prevention (CDC): FootballExhibition.com.br Contact a health care provider:  Before starting a new exercise program.  If you have questions or concerns about your weight.  If you have a medical problem that keeps you from exercising. Get help right away if you have any of the following while exercising:  Injury.  Dizziness.  Difficulty breathing or shortness of breath that does not go away when you stop exercising.  Chest pain.  Rapid heartbeat. Summary  Being overweight increases your risk of heart disease, stroke, diabetes, high blood pressure, and several types of cancer.  Losing weight happens when you burn more calories than you eat.  Reducing the amount of calories you eat in addition to getting regular moderate or vigorous exercise each week helps you lose weight. This information is not intended to replace advice given to you by your health care provider. Make sure you discuss any questions you have with your health care provider. Document Revised: 12/11/2019 Document Reviewed: 12/11/2019 Elsevier Patient Education  2021 [employer].

## 2020-10-08 LAB — CMP14+EGFR
ALT: 11 IU/L (ref 0–32)
AST: 16 IU/L (ref 0–40)
Albumin/Globulin Ratio: 1.4 (ref 1.2–2.2)
Albumin: 4.2 g/dL (ref 3.8–4.8)
Alkaline Phosphatase: 111 IU/L (ref 44–121)
BUN/Creatinine Ratio: 10 — ABNORMAL LOW (ref 12–28)
BUN: 9 mg/dL (ref 8–27)
Bilirubin Total: 0.2 mg/dL (ref 0.0–1.2)
CO2: 26 mmol/L (ref 20–29)
Calcium: 9.7 mg/dL (ref 8.7–10.3)
Chloride: 98 mmol/L (ref 96–106)
Creatinine, Ser: 0.9 mg/dL (ref 0.57–1.00)
GFR calc Af Amer: 79 mL/min/{1.73_m2} (ref >59)
GFR calc non Af Amer: 68 mL/min/{1.73_m2} (ref >59)
Globulin, Total: 3 g/dL (ref 1.5–4.5)
Glucose: 94 mg/dL (ref 65–99)
Potassium: 3.9 mmol/L (ref 3.5–5.2)
Sodium: 140 mmol/L (ref 134–144)
Total Protein: 7.2 g/dL (ref 6.0–8.5)

## 2020-10-08 LAB — HEMOGLOBIN A1C
Est. average glucose Bld gHb Est-mCnc: 111 mg/dL
Hgb A1c MFr Bld: 5.5 % (ref 4.8–5.6)

## 2020-10-08 LAB — TSH: TSH: 1.51 u[IU]/mL (ref 0.450–4.500)

## 2020-10-09 NOTE — Progress Notes (Signed)
Can you schedule f/u thx

## 2020-10-17 MED ORDER — TRIAMCINOLONE ACETONIDE 0.1 % EX CREA: TOPICAL_CREAM | CUTANEOUS | 0 refills | Status: DC

## 2020-11-18 ENCOUNTER — Ambulatory Visit: Payer: PRIVATE HEALTH INSURANCE | Admitting: Internal Medicine

## 2020-11-19 DIAGNOSIS — Z1231 Encounter for screening mammogram for malignant neoplasm of breast: Secondary | ICD-10-CM | POA: Diagnosis not present

## 2020-11-19 LAB — HM MAMMOGRAPHY: HM Mammogram: NORMAL (ref 0–4)

## 2020-12-01 ENCOUNTER — Ambulatory Visit: Payer: PRIVATE HEALTH INSURANCE | Admitting: Nurse Practitioner

## 2020-12-02 ENCOUNTER — Ambulatory Visit: Payer: PRIVATE HEALTH INSURANCE | Admitting: Nurse Practitioner

## 2021-06-22 IMAGING — MR MR CERVICAL SPINE W/O CM
4 of 5 series · 29 of 48 positions shown · non-contrast
Comparison: Radiography 09/20/2020

CLINICAL DATA: Neck pain radiating to the right arm

EXAM:
MRI CERVICAL SPINE WITHOUT CONTRAST
TECHNIQUE: Multiplanar, multisequence MR imaging of the cervical spine was
performed. No intravenous contrast was administered.

[Series 3: T2 · sagittal · 3.0mm · 0.66mm/px · 7 of 17 slices shown (1 of 2)]
[im 1/17]
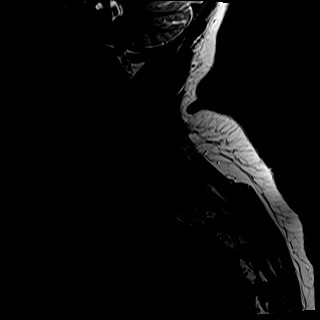
[im 3/17]
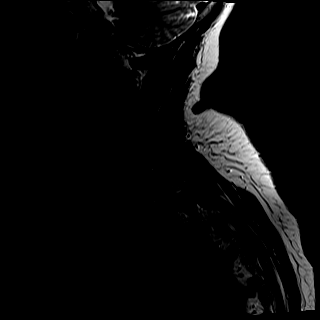
[im 6/17]
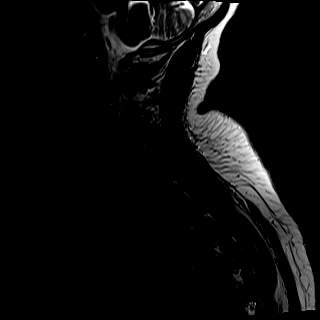
[im 9/17]
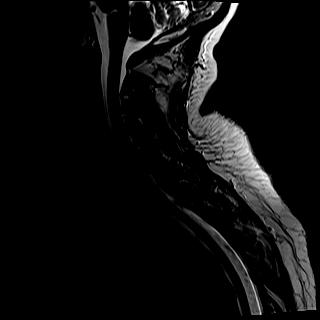
[im 11/17]
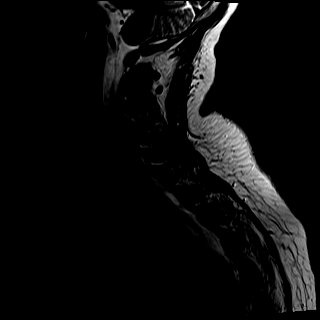
[im 14/17]
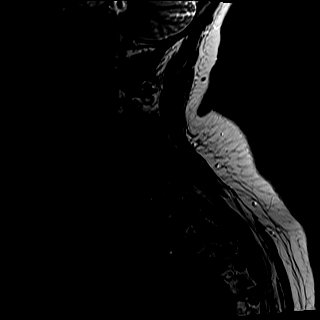
[im 17/17]
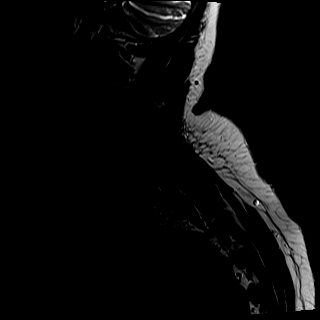

[Series 4: T1 · sagittal · 3.0mm · 0.41mm/px · 7 of 17 slices shown]
[im 1/17]
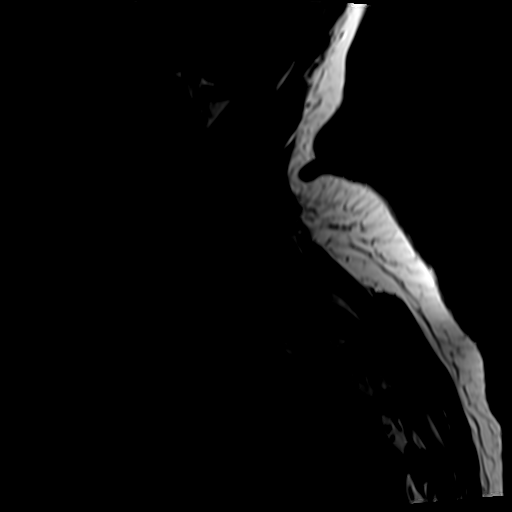
[im 3/17]
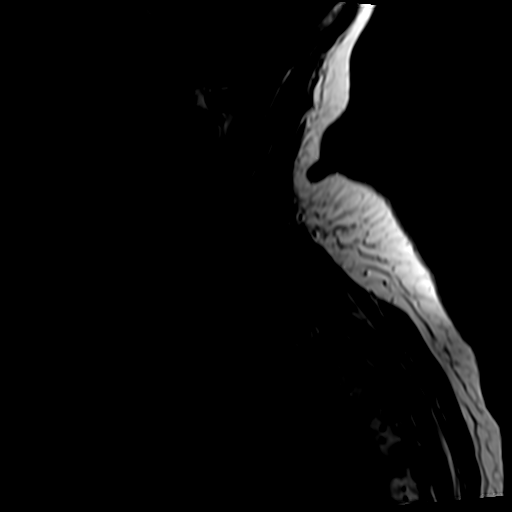
[im 6/17]
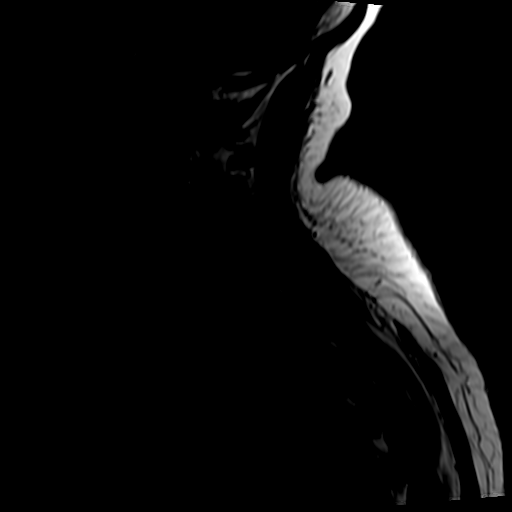
[im 9/17]
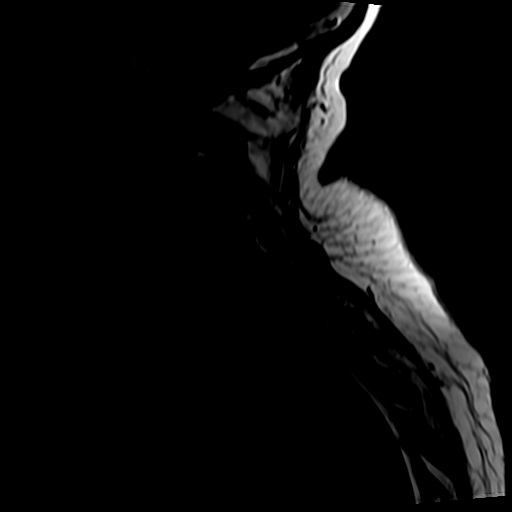
[im 11/17]
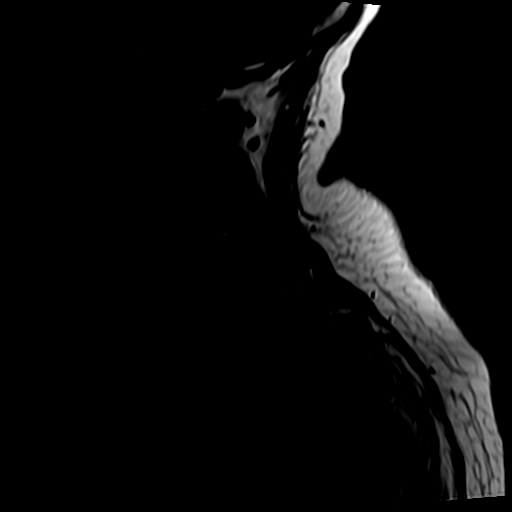
[im 14/17]
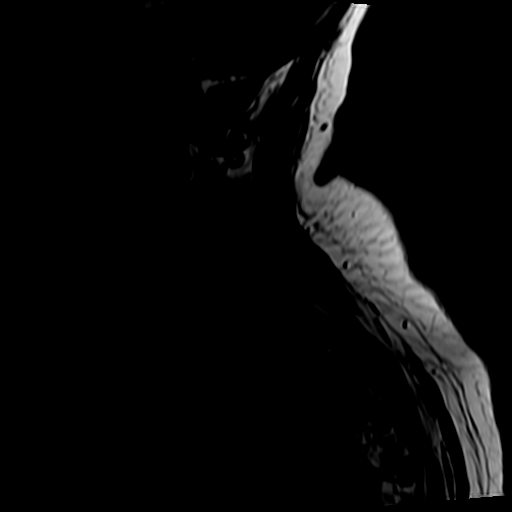
[im 17/17]
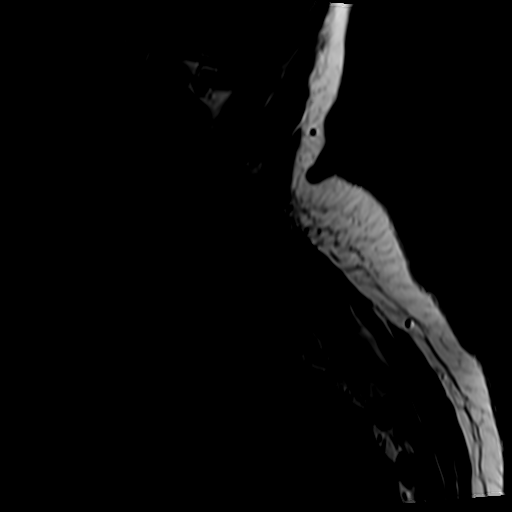

[Series 5: tir sag · sagittal · 3.0mm · 0.41mm/px · 6 of 17 slices shown]
[im 1/17]
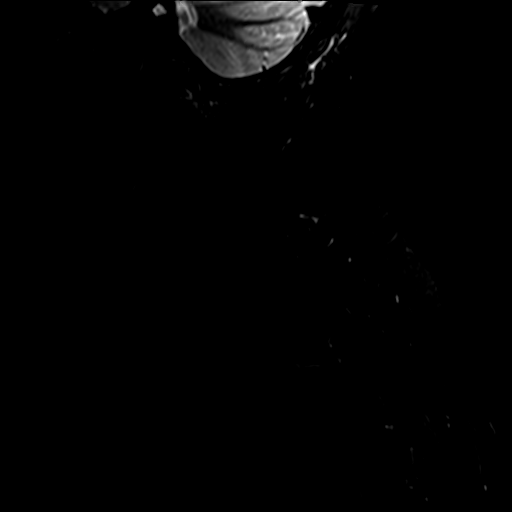
[im 3/17]
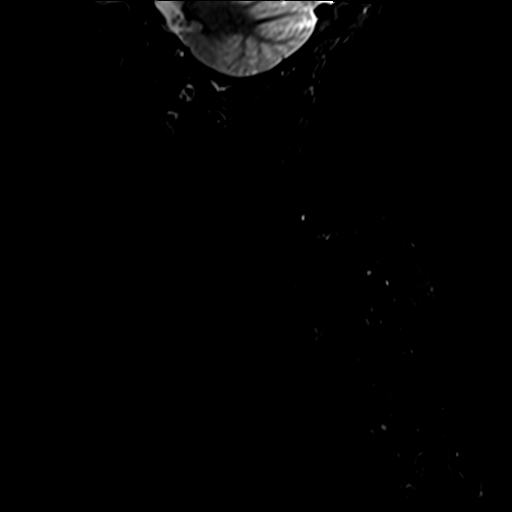
[im 5/17]
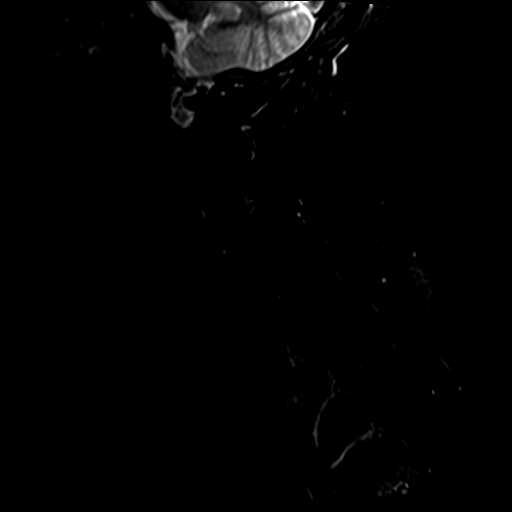
[im 7/17]
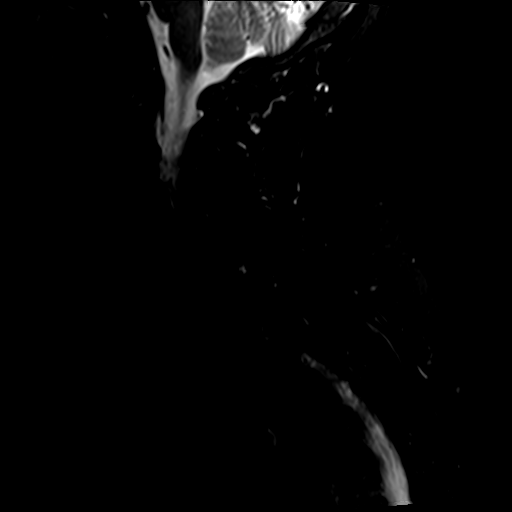
[im 10/17]
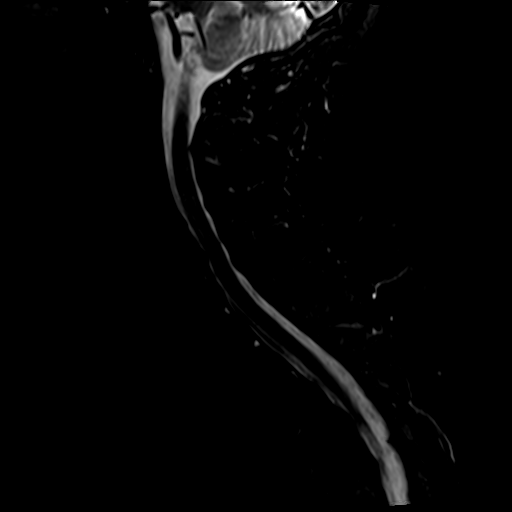
[im 14/17]
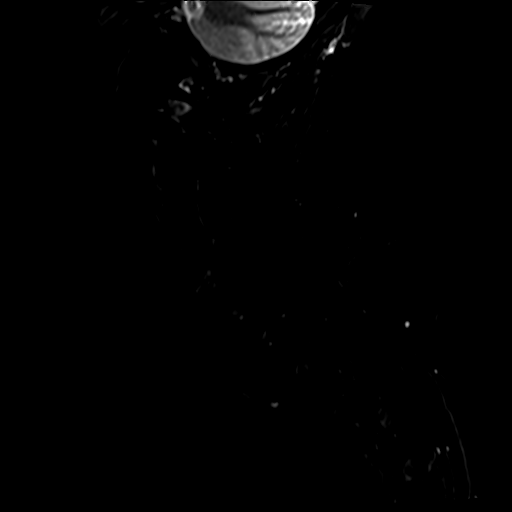

[Series 7: T2 · axial · 3.0mm · 0.70mm/px · z∈[-62,+32]mm · 9 of 28 slices shown (2 of 2)]
[im 1/28]
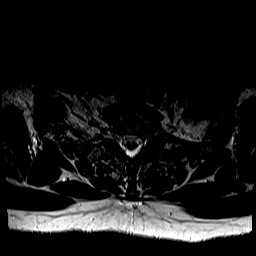
[im 5/28]
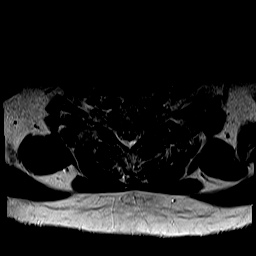
[im 10/28]
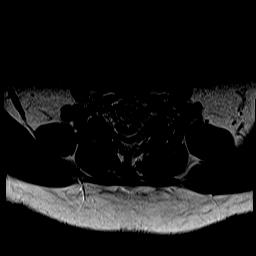
[im 12/28]
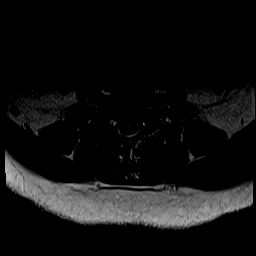
[im 14/28]
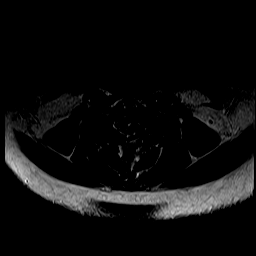
[im 16/28]
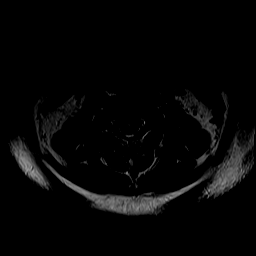
[im 19/28]
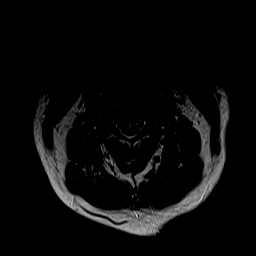
[im 23/28]
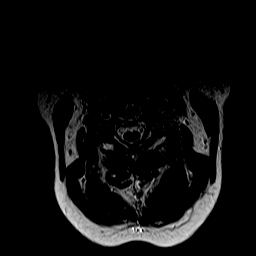
[im 28/28]
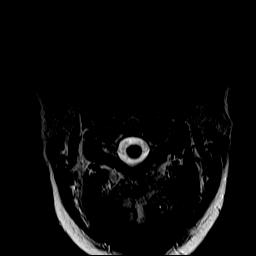

[29 of 48 positions shown; findings below may reference images not displayed]

FINDINGS: Alignment: No malalignment.

Vertebrae: No fracture or focal bone lesion.

Cord: No cord compression or primary cord lesion.

Posterior Fossa, vertebral arteries, paraspinal tissues: Negative

Disc levels:

Foramen magnum is widely patent.  C1-2 is normal.

C2-3: No disc pathology. Facet osteoarthritis on the right without
edema or encroachment upon the neural structures.

C3-4: No disc abnormality.  Mild facet osteoarthritis.  No stenosis.

C4-5: Mild bulging of the disc. No compressive canal or foraminal
narrowing.

C5-6: Spondylosis with endplate osteophytes and bulging of the disc.
Narrowing of the ventral subarachnoid space but no compression of
the cord. Mild foraminal narrowing on the left.

C6-7: Minimal disc bulge.  No canal or foraminal stenosis.

C7-T1: Minimal disc bulge. Mild bilateral facet osteoarthritis. No
canal or foraminal stenosis.
IMPRESSION: 1. Mild degenerative spondylosis at C5-6 and C6-7. No compressive
central canal stenosis. Mild foraminal narrowing on the left at C5-6
but without visible neural compression.
2. Facet osteoarthritis on the right at C2-3 but without edema or
encroachment upon the neural structures. This could possibly be a
cause of neck pain.
3. Mild facet osteoarthritis at C3-4 and C7-T1 but without edema or
encroachment upon the neural structures.

## 2021-08-24 ENCOUNTER — Ambulatory Visit (INDEPENDENT_AMBULATORY_CARE_PROVIDER_SITE_OTHER): Payer: PRIVATE HEALTH INSURANCE | Admitting: Nurse Practitioner

## 2021-08-24 ENCOUNTER — Other Ambulatory Visit: Payer: Self-pay

## 2021-08-24 VITALS — BP 118/72 | HR 79 | Temp 98.4°F | Ht 65.0 in | Wt 206.6 lb

## 2021-08-24 DIAGNOSIS — R519 Headache, unspecified: Secondary | ICD-10-CM | POA: Diagnosis not present

## 2021-08-24 DIAGNOSIS — R9431 Abnormal electrocardiogram [ECG] [EKG]: Secondary | ICD-10-CM | POA: Diagnosis not present

## 2021-08-24 DIAGNOSIS — K219 Gastro-esophageal reflux disease without esophagitis: Secondary | ICD-10-CM | POA: Diagnosis not present

## 2021-08-24 NOTE — Progress Notes (Signed)
I,Amy Greer,acting as a Neurosurgeon for [employer], NP.,have documented all relevant documentation on the behalf of [employer], NP,as directed by  Amy Ivory, NP while in the presence of Amy Ivory, NP.  This visit occurred during the SARS-CoV-2 public health emergency.  Safety protocols were in place, including screening questions prior to the visit, additional usage of staff PPE, and extensive cleaning of exam room while observing appropriate contact time as indicated for disinfecting solutions.  Subjective:     Patient ID: Amy Greer , female    DOB: 01-08-1956 , 65 y.o.   MRN: [id number]   Chief Complaint  Patient presents with   Facial Pain    HPI  Patient is here for ED follow up for facial pain.  She is going out of town for [employer]. She needs to be cleared out for her training. She does not have any more symptoms. Denies SOB, chest pain. She does admit that she was anxious for her new job training. She also has been under some stress due to it. She also has GERD that interferes with her swallowing. She would like to see a GI specialist for that.     Past Medical History:  Diagnosis Date   GERD (gastroesophageal reflux disease)    Lower extremity edema      Family History  Problem Relation Age of Onset   Kidney disease Mother    Hyperlipidemia Mother    Lung cancer Sister    Lung cancer Brother    Ulcers Father      Current Outpatient Medications:    Ascorbic Acid (VITAMIN C) 1000 MG tablet, Take 1,000 mg by mouth as directed. 1-2 TABLETS DAILY, Disp: , Rfl:    Cholecalciferol (VITAMIN D3) 1000 units CAPS, Take by mouth as directed. 2-3 BY MOUTH DAILY, Disp: , Rfl:    Allergies  Allergen Reactions   Penicillins     SWELLING OF THE THROAT      Review of Systems  Constitutional: Negative.  Negative for chills, fatigue and fever.  HENT:  Negative for congestion and facial swelling.   Respiratory: Negative.  Negative for  shortness of breath and wheezing.   Cardiovascular: Negative.  Negative for chest pain and palpitations.  Gastrointestinal:  Negative for constipation and diarrhea.       Hx of GERD   Endocrine: Negative for polydipsia, polyphagia and polyuria.  Neurological: Negative.  Negative for facial asymmetry, speech difficulty, weakness, numbness and headaches.    Today's Vitals   08/24/21 1515  BP: 118/72  Pulse: 79  Temp: 98.4 F (36.9 C)  TempSrc: Oral  Weight: 206 lb 9.6 oz (93.7 kg)  Height: 5\' 5"  (1.651 m)   Body mass index is 34.38 kg/m.   Objective:  Physical Exam Constitutional:      Appearance: Normal appearance.  HENT:     Head: Normocephalic and atraumatic.  Cardiovascular:     Rate and Rhythm: Normal rate and regular rhythm.     Pulses: Normal pulses.     Heart sounds: Normal heart sounds. No murmur heard. Pulmonary:     Effort: Pulmonary effort is normal. No respiratory distress.     Breath sounds: Normal breath sounds. No wheezing.  Musculoskeletal:        General: Normal range of motion.  Skin:    General: Skin is warm and dry.     Capillary Refill: Capillary refill takes less than 2 seconds.  Neurological:     General: No focal deficit  present.     Mental Status: She is alert and oriented to person, place, and time. Mental status is at baseline.     Motor: No weakness.     Coordination: Coordination normal.  Psychiatric:        Mood and Affect: Mood normal.        Behavior: Behavior normal.        Assessment And Plan:     1. Facial pain -She was seen in the ED recently. Labs and imagining were all unremarkable for ACS, pneumothorax and pneumonia. She was discharged in stable condition. EKG did show T wave abnormality otherwise NSR.  -She denies any more facial pain, chest pain or shortness of breath.   2. Abnormal finding on EKG - Ambulatory referral to Cardiology  3. Gastroesophageal reflux disease without esophagitis -Has hx of GERD; takes  omeprazole 3 times a week.  - Ambulatory referral to Gastroenterology   The patient was encouraged to call or send a message through MyChart for any questions or concerns.   Follow up: if symptoms persist or do not get better.   Side effects and appropriate use of all the medication(s) were discussed with the patient today. Patient advised to use the medication(s) as directed by their healthcare provider. The patient was encouraged to read, review, and understand all associated package inserts and contact our office with any questions or concerns. The patient accepts the risks of the treatment plan and had an opportunity to ask questions.   Patient was given opportunity to ask questions. Patient verbalized understanding of the plan and was able to repeat key elements of the plan. All questions were answered to their satisfaction.  Amy Ringo Sherod, DNP   I, Amy Greer have reviewed all documentation for this visit. The documentation on 08/24/21 for the exam, diagnosis, procedures, and orders are all accurate and complete.      IF YOU HAVE BEEN REFERRED TO A SPECIALIST, IT MAY TAKE 1-2 WEEKS TO SCHEDULE/PROCESS THE REFERRAL. IF YOU HAVE NOT HEARD FROM US/SPECIALIST IN TWO WEEKS, PLEASE GIVE [place] A CALL AT [phone].   THE PATIENT IS ENCOURAGED TO PRACTICE SOCIAL DISTANCING DUE TO THE COVID-19 PANDEMIC.

## 2022-01-30 ENCOUNTER — Ambulatory Visit (INDEPENDENT_AMBULATORY_CARE_PROVIDER_SITE_OTHER): Payer: BC Managed Care – PPO | Admitting: Internal Medicine

## 2022-01-30 ENCOUNTER — Encounter: Payer: Self-pay | Admitting: Internal Medicine

## 2022-01-30 VITALS — BP 122/80 | HR 76 | Temp 98.2°F

## 2022-01-30 DIAGNOSIS — E559 Vitamin D deficiency, unspecified: Secondary | ICD-10-CM

## 2022-01-30 DIAGNOSIS — R5383 Other fatigue: Secondary | ICD-10-CM | POA: Diagnosis not present

## 2022-01-30 DIAGNOSIS — K219 Gastro-esophageal reflux disease without esophagitis: Secondary | ICD-10-CM

## 2022-01-30 DIAGNOSIS — Z79899 Other long term (current) drug therapy: Secondary | ICD-10-CM

## 2022-01-30 DIAGNOSIS — R400 Somnolence: Secondary | ICD-10-CM | POA: Diagnosis not present

## 2022-01-30 DIAGNOSIS — M25472 Effusion, left ankle: Secondary | ICD-10-CM

## 2022-01-30 NOTE — Progress Notes (Signed)
THIS VISIT OCCURRED DURING POWER OUTAGE.   Safety protocols were in place, including screening questions prior to the visit, additional usage of staff PPE, and extensive cleaning of exam room while observing appropriate contact time as indicated for disinfecting solutions.  Subjective:     Patient ID: Amy Greer , female    DOB: 10/03/1955 , 66 y.o.   MRN: [id number]   Chief Complaint  Patient presents with   Fatigue    HPI  She presents today for further evaluation of fatigue. She states her sx have worsened over the past two months. She has a new job, she works for [employer]. She was hired in March and has been in training for the past two months.  She feels like she hit "rock bottom" in May. She feels tired despite taking vitamins C and D. She feels she sleeps fairly well. Admits she gets up 1-2 times per night. i    Past Medical History:  Diagnosis Date   GERD (gastroesophageal reflux disease)    Lower extremity edema      Family History  Problem Relation Age of Onset   Kidney disease Mother    Hyperlipidemia Mother    Lung cancer Sister    Lung cancer Brother    Ulcers Father      Current Outpatient Medications:    Ascorbic Acid (VITAMIN C) 1000 MG tablet, Take 1,000 mg by mouth as directed. 1-2 TABLETS DAILY, Disp: , Rfl:    Cholecalciferol (VITAMIN D3) 1000 units CAPS, Take by mouth as directed. 2-3 BY MOUTH DAILY, Disp: , Rfl:    Allergies  Allergen Reactions   Penicillins     SWELLING OF THE THROAT     Review of Systems  Constitutional:  Positive for fatigue. Negative for activity change and appetite change.  Respiratory: Negative.    Cardiovascular: Negative.   Gastrointestinal: Negative.        C/o reflux. Currently taking omeprazole 20mg daily. She does not feel it is effective. Denies n/v. Feels like food gets stuck in her chest. She does not have any pain/difficulty swallowing.   Musculoskeletal:        Left ankle swelling. Denies recent  fall/trauma.   Neurological: Negative.   Psychiatric/Behavioral: Negative.      Today's Vitals   01/30/22 2026  BP: 122/80  Pulse: 76  Temp: 98.2 F (36.8 C)   There is no height or weight on file to calculate BMI.   Objective:  Physical Exam Vitals and nursing note reviewed.  Constitutional:      Appearance: Normal appearance.  HENT:     Head: Normocephalic and atraumatic.  Eyes:     Extraocular Movements: Extraocular movements intact.  Cardiovascular:     Rate and Rhythm: Normal rate and regular rhythm.     Heart sounds: Normal heart sounds.  Pulmonary:     Effort: Pulmonary effort is normal.     Breath sounds: Normal breath sounds.  Musculoskeletal:        General: Swelling present.     Cervical back: Normal range of motion.     Comments: Pitting edema at left medial malleolus  Skin:    General: Skin is warm.  Neurological:     General: No focal deficit present.     Mental Status: She is alert.  Psychiatric:        Mood and Affect: Mood normal.        Behavior: Behavior normal.     Assessment And Plan:       1. Other fatigue Comments: Chronic, I will check labs as listed below. She admits she only drinks one bottle of water daily. Encouraged to increase to at least 32 oz/day.  - CBC no Diff; Future - CMP14+EGFR; Future - TSH; Future - Magnesium; Future  2. Daytime somnolence Comments: She also reports snoring, non-restorative sleep and nocturia. I will refer to Neuro for sleep study evaluation.  - Ambulatory referral to Neurology  3. Gastroesophageal reflux disease without esophagitis Comments: Reminded to stop eating 3 hours prior to lying down. Advised to avoid triggers, will change omeprazole to pantoprazole 80m daily.   4. Vitamin D deficiency disease Comments: I will check vitamin D level today.  - Vitamin D (25 hydroxy); Future  5. Left ankle swelling Comments: She should elevate her leg while seated and decrease her salt intake. She is also  advised to increase her water intake. She will let me know if sx persist.   6. Drug therapy Comments: She has been on chronic PPI therapy. I will check vitamin B12 level today, along with her renal function.  - CMP14+EGFR; Future - Vitamin B12; Future   Patient was given opportunity to ask questions. Patient verbalized understanding of the plan and was able to repeat key elements of the plan. All questions were answered to their satisfaction.   I, RMaximino Greenland MD, have reviewed all documentation for this visit. The documentation on 01/30/22 for the exam, diagnosis, procedures, and orders are all accurate and complete.   IF YOU HAVE BEEN REFERRED TO A SPECIALIST, IT MAY TAKE 1-2 WEEKS TO SCHEDULE/PROCESS THE REFERRAL. IF YOU HAVE NOT HEARD FROM US/SPECIALIST IN TWO WEEKS, PLEASE GIVE [place] CALL AT [phone].   THE PATIENT IS ENCOURAGED TO PRACTICE SOCIAL DISTANCING DUE TO THE COVID-19 PANDEMIC.

## 2022-01-30 NOTE — Patient Instructions (Signed)

## 2022-02-06 ENCOUNTER — Ambulatory Visit: Payer: PRIVATE HEALTH INSURANCE

## 2022-02-06 ENCOUNTER — Other Ambulatory Visit: Payer: PRIVATE HEALTH INSURANCE

## 2022-02-06 ENCOUNTER — Other Ambulatory Visit: Payer: Self-pay

## 2022-02-06 DIAGNOSIS — R5383 Other fatigue: Secondary | ICD-10-CM

## 2022-02-06 DIAGNOSIS — Z79899 Other long term (current) drug therapy: Secondary | ICD-10-CM

## 2022-02-06 DIAGNOSIS — E559 Vitamin D deficiency, unspecified: Secondary | ICD-10-CM

## 2022-02-06 LAB — CBC
Hematocrit: 38.7 % (ref 34.0–46.6)
Hemoglobin: 13.2 g/dL (ref 11.1–15.9)
MCH: 30.9 pg (ref 26.6–33.0)
MCHC: 34.1 g/dL (ref 31.5–35.7)
MCV: 91 fL (ref 79–97)
Platelets: 310 10*3/uL (ref 150–450)
RBC: 4.27 x10E6/uL (ref 3.77–5.28)
RDW: 12.9 % (ref 11.7–15.4)
WBC: 6.4 10*3/uL (ref 3.4–10.8)

## 2022-02-07 LAB — CMP14+EGFR
ALT: 18 IU/L (ref 0–32)
AST: 14 IU/L (ref 0–40)
Albumin/Globulin Ratio: 1.5 (ref 1.2–2.2)
Albumin: 4.3 g/dL (ref 3.8–4.8)
Alkaline Phosphatase: 106 IU/L (ref 44–121)
BUN/Creatinine Ratio: 9 — ABNORMAL LOW (ref 12–28)
BUN: 9 mg/dL (ref 8–27)
Bilirubin Total: 0.3 mg/dL (ref 0.0–1.2)
CO2: 25 mmol/L (ref 20–29)
Calcium: 9.2 mg/dL (ref 8.7–10.3)
Chloride: 100 mmol/L (ref 96–106)
Creatinine, Ser: 0.95 mg/dL (ref 0.57–1.00)
Globulin, Total: 2.8 g/dL (ref 1.5–4.5)
Glucose: 115 mg/dL — ABNORMAL HIGH (ref 70–99)
Potassium: 3.4 mmol/L — ABNORMAL LOW (ref 3.5–5.2)
Sodium: 141 mmol/L (ref 134–144)
Total Protein: 7.1 g/dL (ref 6.0–8.5)
eGFR: 67 mL/min/{1.73_m2} (ref >59)

## 2022-02-07 LAB — VITAMIN B12: Vitamin B-12: 528 pg/mL (ref 232–1245)

## 2022-02-07 LAB — MAGNESIUM: Magnesium: 2 mg/dL (ref 1.6–2.3)

## 2022-02-07 LAB — TSH: TSH: 1.64 u[IU]/mL (ref 0.450–4.500)

## 2022-02-07 LAB — VITAMIN D 25 HYDROXY (VIT D DEFICIENCY, FRACTURES): Vit D, 25-Hydroxy: 80 ng/mL (ref 30.0–100.0)

## 2022-03-08 ENCOUNTER — Encounter: Payer: Self-pay | Admitting: Internal Medicine

## 2022-03-20 LAB — HM MAMMOGRAPHY: HM Mammogram: ABNORMAL — AB (ref 0–4)

## 2022-04-02 ENCOUNTER — Encounter (HOSPITAL_BASED_OUTPATIENT_CLINIC_OR_DEPARTMENT_OTHER): Payer: Self-pay | Admitting: Emergency Medicine

## 2022-04-02 ENCOUNTER — Emergency Department (HOSPITAL_BASED_OUTPATIENT_CLINIC_OR_DEPARTMENT_OTHER)
Admission: EM | Admit: 2022-04-02 | Discharge: 2022-04-02 | Disposition: A | Discharge status: Home / Self Care | Payer: BC Managed Care – PPO | Attending: Emergency Medicine | Admitting: Emergency Medicine

## 2022-04-02 ENCOUNTER — Other Ambulatory Visit: Payer: Self-pay

## 2022-04-02 DIAGNOSIS — M7989 Other specified soft tissue disorders: Secondary | ICD-10-CM | POA: Diagnosis not present

## 2022-04-02 LAB — CBC WITH DIFFERENTIAL/PLATELET
Abs Immature Granulocytes: 0.01 10*3/uL (ref 0.00–0.07)
Basophils Absolute: 0.1 10*3/uL (ref 0.0–0.1)
Basophils Relative: 1 %
Eosinophils Absolute: 0.1 10*3/uL (ref 0.0–0.5)
Eosinophils Relative: 1 %
HCT: 38.2 % (ref 36.0–46.0)
Hemoglobin: 13.2 g/dL (ref 12.0–15.0)
Immature Granulocytes: 0 %
Lymphocytes Relative: 43 %
Lymphs Abs: 2.9 10*3/uL (ref 0.7–4.0)
MCH: 31 pg (ref 26.0–34.0)
MCHC: 34.6 g/dL (ref 30.0–36.0)
MCV: 89.7 fL (ref 80.0–100.0)
Monocytes Absolute: 0.5 10*3/uL (ref 0.1–1.0)
Monocytes Relative: 8 %
Neutro Abs: 3.2 10*3/uL (ref 1.7–7.7)
Neutrophils Relative %: 47 %
Platelets: 316 10*3/uL (ref 150–400)
RBC: 4.26 MIL/uL (ref 3.87–5.11)
RDW: 12.7 % (ref 11.5–15.5)
WBC: 6.7 10*3/uL (ref 4.0–10.5)
nRBC: 0 % (ref 0.0–0.2)

## 2022-04-02 LAB — COMPREHENSIVE METABOLIC PANEL
ALT: 19 U/L (ref 0–44)
AST: 18 U/L (ref 15–41)
Albumin: 3.5 g/dL (ref 3.5–5.0)
Alkaline Phosphatase: 84 U/L (ref 38–126)
Anion gap: 3 — ABNORMAL LOW (ref 5–15)
BUN: 9 mg/dL (ref 8–23)
CO2: 28 mmol/L (ref 22–32)
Calcium: 8.9 mg/dL (ref 8.9–10.3)
Chloride: 108 mmol/L (ref 98–111)
Creatinine, Ser: 0.84 mg/dL (ref 0.44–1.00)
GFR, Estimated: >60 mL/min (ref >60)
Glucose, Bld: 101 mg/dL — ABNORMAL HIGH (ref 70–99)
Potassium: 3.2 mmol/L — ABNORMAL LOW (ref 3.5–5.1)
Sodium: 139 mmol/L (ref 135–145)
Total Bilirubin: 0.7 mg/dL (ref 0.3–1.2)
Total Protein: 7.2 g/dL (ref 6.5–8.1)

## 2022-04-02 NOTE — ED Provider Notes (Signed)
[facility] EMERGENCY DEPARTMENT Provider Note   CSN: [id number] Arrival date & time: 04/02/22  2007     History  Chief Complaint  Patient presents with   Joint Swelling    Amy Greer is a 66 y.o. female.  HPI    66 yo female w/o any significant medical hx presenting to ED for left ankle and left leg swelling. She says the swelling started in her left ankle approximately 1 year ago. She reports the swelling is usually only around the left ankle, but she came to the ED today after the swelling extended to the middle of her anterior left leg yesterday. She endorses the swelling improves when she elevates her leg. She has tried bio freeze and massaging with lotion, which only temporarily relieves the sxs. She noticed a rash that was itchy and painful to touch on the medial side of her ankle. She says the rash has come around on the lateral side of her ankle. Over the year the rash would flare up with the swelling, causing pain and itchiness. She denies pain while ambulating, blisters or trauma to the area. She denies insect bites, surgeries to the left leg, fever. She denies rashes anywhere else in the body. She denies sob, chest pain.    Home Medications Prior to Admission medications   Medication Sig Start Date End Date Taking? Authorizing Provider  Ascorbic Acid (VITAMIN C) 1000 MG tablet Take 1,000 mg by mouth as directed. 1-2 TABLETS DAILY    [provider]  Cholecalciferol (VITAMIN D3) 1000 units CAPS Take by mouth as directed. 2-3 BY MOUTH DAILY    [provider]      Allergies    Penicillins    Review of Systems   Review of Systems  All other systems reviewed and are negative.   Physical Exam Updated Vital Signs BP 129/70 (BP Location: Left Arm)   Pulse 70   Temp 98.4 F (36.9 C) (Oral)   Resp 18   Ht 5\' 5"  (1.651 m)   Wt 94.3 kg   SpO2 99%   BMI 34.61 kg/m  Physical Exam Vitals and nursing note reviewed.  Constitutional:       Appearance: She is well-developed.  HENT:     Head: Atraumatic.  Eyes:     Extraocular Movements: Extraocular movements intact.     Pupils: Pupils are equal, round, and reactive to light.  Cardiovascular:     Rate and Rhythm: Normal rate.  Pulmonary:     Effort: Pulmonary effort is normal.  Musculoskeletal:     Cervical back: Normal range of motion and neck supple.  Skin:    General: Skin is warm and dry.  Neurological:     Mental Status: She is alert and oriented to person, place, and time.    ED Results / Procedures / Treatments   Labs (all labs ordered are listed, but only abnormal results are displayed) Labs Reviewed  CBC WITH DIFFERENTIAL/PLATELET  COMPREHENSIVE METABOLIC PANEL    EKG None  Radiology No results found.  Procedures Procedures    Medications Ordered in ED Medications - No data to display  ED Course/ Medical Decision Making/ A&P                           Medical Decision Making Amount and/or Complexity of Data Reviewed Labs: ordered.   This patient presents to the ED with chief complaint(s) of leg swelling that has  been now present off and on for last year.  She has no significant medical history.  No risk factors for DVT, PE.  She has 2+ dorsalis pedis.  Her leg is slightly red and warm to touch, however she has mosquito bites in the region of redness and states that in the past she has had redness like this with swelling of the leg.  The differential diagnosis includes DVT, superficial phlebitis, stasis dermatitis, venous insufficiency.  Low suspicion for cellulitis.  Does not appear to be an arterial insufficiency.  The initial plan is to still get DVT study, as patient is concerned primarily for DVT.  I informed her that my pretest probability for DVT is low.  We have advised her to follow-up with her PCP for further assessment.   Additional history obtained: Additional history obtained from spouse  We have ordered an outpatient  ultrasound for tomorrow. Final Clinical Impression(s) / ED Diagnoses Final diagnoses:  Leg swelling    Rx / DC Orders ED Discharge Orders          Ordered    VAS [place] LOWER EXTREMITY VENOUS (DVT)        04/02/22 2240              Derwood Kaplan, MD 04/02/22 2256

## 2022-04-02 NOTE — ED Notes (Signed)
ED Provider at bedside. 

## 2022-04-02 NOTE — ED Triage Notes (Signed)
Reports swelling to left ankle for about a year.  Some redness noted.  Seen by pcp in June.  Had blood work done then that was good.

## 2022-04-02 NOTE — Discharge Instructions (Addendum)
You are seen in the ER for leg swelling.  Given that the swelling in your leg has been coming and going regularly for the last several months, suspicion for blood clot is low.  However, that diagnosis still needs to be ruled out therefore we have ordered an outpatient DVT study.  Other possibility includes that you have venous insufficiency is due to poor circulation by her veins and lymphatic systems or due to obstructive tumors.  We recommend that you follow-up with your primary care doctor for further evaluation if your ultrasound is negative for blood clots.

## 2022-04-02 NOTE — ED Notes (Addendum)
Pt scheduled for US DVT study for Tuesday at 0900.  Verb understanding of appointment

## 2022-04-04 ENCOUNTER — Ambulatory Visit (HOSPITAL_BASED_OUTPATIENT_CLINIC_OR_DEPARTMENT_OTHER)
Admission: RE | Admit: 2022-04-04 | Discharge: 2022-04-04 | Disposition: A | Discharge status: Home / Self Care | Payer: BC Managed Care – PPO | Source: Ambulatory Visit | Attending: Emergency Medicine | Admitting: Emergency Medicine

## 2022-04-04 DIAGNOSIS — M7989 Other specified soft tissue disorders: Secondary | ICD-10-CM | POA: Insufficient documentation

## 2022-04-13 ENCOUNTER — Ambulatory Visit: Payer: PRIVATE HEALTH INSURANCE | Admitting: Neurology

## 2022-04-17 ENCOUNTER — Other Ambulatory Visit: Payer: Self-pay | Admitting: Gastroenterology

## 2022-04-17 DIAGNOSIS — K449 Diaphragmatic hernia without obstruction or gangrene: Secondary | ICD-10-CM

## 2022-04-20 ENCOUNTER — Ambulatory Visit
Admission: RE | Admit: 2022-04-20 | Discharge: 2022-04-20 | Disposition: A | Discharge status: Home / Self Care | Payer: BC Managed Care – PPO | Source: Ambulatory Visit | Attending: Gastroenterology | Admitting: Gastroenterology

## 2022-04-20 DIAGNOSIS — K449 Diaphragmatic hernia without obstruction or gangrene: Secondary | ICD-10-CM

## 2022-04-24 ENCOUNTER — Other Ambulatory Visit: Payer: Self-pay

## 2022-06-26 ENCOUNTER — Encounter: Payer: BC Managed Care – PPO | Admitting: Nurse Practitioner

## 2022-08-03 ENCOUNTER — Encounter: Payer: PRIVATE HEALTH INSURANCE | Admitting: Internal Medicine

## 2022-12-06 ENCOUNTER — Encounter: Payer: PRIVATE HEALTH INSURANCE | Admitting: Internal Medicine

## 2022-12-19 ENCOUNTER — Other Ambulatory Visit: Payer: Self-pay | Admitting: *Deleted

## 2022-12-19 DIAGNOSIS — R600 Localized edema: Secondary | ICD-10-CM

## 2022-12-21 ENCOUNTER — Emergency Department (HOSPITAL_BASED_OUTPATIENT_CLINIC_OR_DEPARTMENT_OTHER)
Admission: EM | Admit: 2022-12-21 | Discharge: 2022-12-21 | Disposition: A | Discharge status: Home / Self Care | Payer: BC Managed Care – PPO | Attending: Emergency Medicine | Admitting: Emergency Medicine

## 2022-12-21 ENCOUNTER — Emergency Department (HOSPITAL_BASED_OUTPATIENT_CLINIC_OR_DEPARTMENT_OTHER): Payer: BC Managed Care – PPO

## 2022-12-21 ENCOUNTER — Encounter (HOSPITAL_BASED_OUTPATIENT_CLINIC_OR_DEPARTMENT_OTHER): Payer: Self-pay | Admitting: Pediatrics

## 2022-12-21 ENCOUNTER — Other Ambulatory Visit: Payer: Self-pay

## 2022-12-21 DIAGNOSIS — E876 Hypokalemia: Secondary | ICD-10-CM | POA: Diagnosis not present

## 2022-12-21 DIAGNOSIS — R002 Palpitations: Secondary | ICD-10-CM | POA: Insufficient documentation

## 2022-12-21 DIAGNOSIS — R42 Dizziness and giddiness: Secondary | ICD-10-CM | POA: Diagnosis not present

## 2022-12-21 LAB — BASIC METABOLIC PANEL
Anion gap: 8 (ref 5–15)
BUN: 9 mg/dL (ref 8–23)
CO2: 28 mmol/L (ref 22–32)
Calcium: 8.8 mg/dL — ABNORMAL LOW (ref 8.9–10.3)
Chloride: 103 mmol/L (ref 98–111)
Creatinine, Ser: 0.85 mg/dL (ref 0.44–1.00)
GFR, Estimated: >60 mL/min (ref >60)
Glucose, Bld: 135 mg/dL — ABNORMAL HIGH (ref 70–99)
Potassium: 3.3 mmol/L — ABNORMAL LOW (ref 3.5–5.1)
Sodium: 139 mmol/L (ref 135–145)

## 2022-12-21 LAB — CBC
HCT: 39.4 % (ref 36.0–46.0)
Hemoglobin: 13.3 g/dL (ref 12.0–15.0)
MCH: 30.5 pg (ref 26.0–34.0)
MCHC: 33.8 g/dL (ref 30.0–36.0)
MCV: 90.4 fL (ref 80.0–100.0)
Platelets: 302 10*3/uL (ref 150–400)
RBC: 4.36 MIL/uL (ref 3.87–5.11)
RDW: 12.9 % (ref 11.5–15.5)
WBC: 6.4 10*3/uL (ref 4.0–10.5)
nRBC: 0 % (ref 0.0–0.2)

## 2022-12-21 LAB — TROPONIN I (HIGH SENSITIVITY)
Troponin I (High Sensitivity): 3 ng/L (ref <18)
Troponin I (High Sensitivity): 3 ng/L (ref <18)

## 2022-12-21 LAB — MAGNESIUM: Magnesium: 2 mg/dL (ref 1.7–2.4)

## 2022-12-21 MED ORDER — POTASSIUM CHLORIDE CRYS ER 20 MEQ PO TBCR
20.0000 meq | EXTENDED_RELEASE_TABLET | Freq: Once | ORAL | Status: AC
  Administered 2022-12-21: 20 meq via ORAL
  Filled 2022-12-21: qty 1

## 2022-12-21 NOTE — ED Notes (Signed)
D/c paperwork reviewed with pt, including follow up care.  No questions or concerns voiced at time of d/c. . Pt verbalized understanding, Ambulatory with family to ED exit, NAD.   

## 2022-12-21 NOTE — ED Triage Notes (Signed)
C/O intermittent palpitations x [redacted] weeks along with dizziness and light headedness. C/O aching on upper chest that comes and go in nature as well.

## 2022-12-21 NOTE — ED Provider Notes (Signed)
[place] EMERGENCY DEPARTMENT AT [facility] Provider Note   CSN: [id number] Arrival date & time: 12/21/22  1235     History  Chief Complaint  Patient presents with   Palpitations    Amy Greer is a 67 y.o. female.  HPI   67 year old female presents with episodes of palpitations and lightheadedness. She states they typically happen in the morning while at work. Last a few minutes and then resolve. In between she feels baseline. No sob, back pain, leg swelling, fever. Does not see a cardiologist and does not have cardiac history. Currently is asymptomatic and feels well.   Home Medications Prior to Admission medications   Medication Sig Start Date End Date Taking? Authorizing Provider  Ascorbic Acid (VITAMIN C) 1000 MG tablet Take 1,000 mg by mouth as directed. 1-2 TABLETS DAILY    [provider]  Cholecalciferol (VITAMIN D3) 1000 units CAPS Take by mouth as directed. 2-3 BY MOUTH DAILY    [provider]      Allergies    Penicillins    Review of Systems   Review of Systems  Constitutional:  Negative for fatigue and fever.  Respiratory:  Negative for cough, chest tightness and shortness of breath.   Cardiovascular:  Positive for palpitations. Negative for chest pain and leg swelling.  Gastrointestinal:  Negative for abdominal pain, diarrhea and vomiting.  Genitourinary:  Negative for flank pain.  Musculoskeletal:  Negative for back pain.  Skin:  Negative for rash.  Neurological:  Positive for light-headedness. Negative for headaches.    Physical Exam Updated Vital Signs BP 117/65   Pulse 62   Temp 98.2 F (36.8 C) (Oral)   Resp 17   Ht  (1.651 m)   Wt 81.6 kg   SpO2 95%   BMI 29.95 kg/m  Physical Exam Vitals and nursing note reviewed.  Constitutional:      General: She is not in acute distress.    Appearance: Normal appearance. She is not ill-appearing.  HENT:     Head: Normocephalic.     Mouth/Throat:      Mouth: Mucous membranes are moist.  Cardiovascular:     Rate and Rhythm: Normal rate and regular rhythm.     Heart sounds: No murmur heard. Pulmonary:     Effort: Pulmonary effort is normal. No respiratory distress.     Breath sounds: Normal breath sounds. No rales.  Abdominal:     Palpations: Abdomen is soft.     Tenderness: There is no abdominal tenderness.  Musculoskeletal:     Right lower leg: No edema.     Left lower leg: No edema.  Skin:    General: Skin is warm.  Neurological:     Mental Status: She is alert and oriented to person, place, and time. Mental status is at baseline.  Psychiatric:        Mood and Affect: Mood normal.     ED Results / Procedures / Treatments   Labs (all labs ordered are listed, but only abnormal results are displayed) Labs Reviewed  BASIC METABOLIC PANEL - Abnormal; Notable for the following components:      Result Value   Potassium 3.3 (*)    Glucose, Bld 135 (*)    Calcium 8.8 (*)    All other components within normal limits  CBC  MAGNESIUM  TSH  TROPONIN I (HIGH SENSITIVITY)  TROPONIN I (HIGH SENSITIVITY)    EKG EKG Interpretation  Date/Time:  Thursday  December 21 2022 12:46:38 EDT Ventricular Rate:  70 PR Interval:  152 QRS Duration: 102 QT Interval:  398 QTC Calculation: 430 R Axis:   24 Text Interpretation: Sinus rhythm Nonspecific T abnormalities, lateral leads ST elevation, consider inferior injury Confirmed by Coralee Pesa [phone]) on 12/21/2022 2:32:02 PM  Radiology DG Chest 2 View  Result Date: 12/21/2022 CLINICAL DATA:  Palpitations EXAM: CHEST - 2 VIEW COMPARISON:  CT Chest 10/26/09 FINDINGS: No pleural effusion. No pneumothorax. Linear airspace of the right lung apex are nonspecific and could represent scarring. Normal cardiac and mediastinal contours. No radiographically apparent displaced rib fractures. Vertebral body heights are maintained. Surgical clips in the right upper quadrant. IMPRESSION: Linear airspace at the  right lung apex are nonspecific and could represent scarring. These were previously assessed in 2011 at which time an underlying slow growing bronchoalveolar cell carcinoma was also a differential consideration. Electronically Signed   By: Lorenza Cambridge M.D.   On: 12/21/2022 13:07    Procedures Procedures    Medications Ordered in ED Medications  potassium chloride SA (KLOR-CON M) CR tablet 20 mEq (has no administration in time range)    ED Course/ Medical Decision Making/ A&P                             Medical Decision Making Amount and/or Complexity of Data Reviewed Labs: ordered. Radiology: ordered.  Risk Prescription drug management.   67 yo female here with brief self resolved episodes of palpations and lightheadedness. States it feels like her heart races. No associated sob, leg swelling or other red flags. VSS on arrival and she is currently asymptomatic and well appearing.  EKG is NSR. Blood work shows mild hypokalemia with normal Mag. Trops negative and no delta. CXR unremarkable. TSH sent, results pending.  No episodes here in the department. Doubt ACS given current workup and no CP. Doubt PE given asymptomatic nature and normal oxygen. Plan for outpatient f/u and cardiology referral. Patient and family agree.   Patient discharged.         Final Clinical Impression(s) / ED Diagnoses Final diagnoses:  Palpitations    Rx / DC Orders ED Discharge Orders     None         Rozelle Logan, DO 12/21/22 1558

## 2022-12-21 NOTE — Discharge Instructions (Addendum)
You have been seen in the ER. Your heart work up was normal. Your potassium was slightly low. You were given a dose here in the department. Have repeat blood work with primary for re evaluation.   Thyroid hormone was sent today but will not result today. PCP can follow up results and they will be available on mychart.   Follow up with cardiology for further evaluation and possible holter monitor. Return for any worsening symptoms, chest pain, shortness of breath, concerns for your health.

## 2022-12-21 NOTE — ED Notes (Signed)
Lab notified to add-on magnesium to previously collected blood  sample.   

## 2022-12-22 LAB — TSH: TSH: 1.109 u[IU]/mL (ref 0.350–4.500)

## 2022-12-28 ENCOUNTER — Ambulatory Visit (INDEPENDENT_AMBULATORY_CARE_PROVIDER_SITE_OTHER)
Admission: RE | Admit: 2022-12-28 | Discharge: 2022-12-28 | Disposition: A | Discharge status: Home / Self Care | Payer: BC Managed Care – PPO | Source: Ambulatory Visit | Attending: Vascular Surgery | Admitting: Vascular Surgery

## 2022-12-28 ENCOUNTER — Ambulatory Visit: Payer: BC Managed Care – PPO | Admitting: Physician Assistant

## 2022-12-28 ENCOUNTER — Encounter: Payer: Self-pay | Admitting: Physician Assistant

## 2022-12-28 VITALS — BP 124/80 | HR 62 | Temp 98.0°F | Resp 20 | Ht 65.0 in | Wt 206.0 lb

## 2022-12-28 DIAGNOSIS — M7989 Other specified soft tissue disorders: Secondary | ICD-10-CM

## 2022-12-28 DIAGNOSIS — R600 Localized edema: Secondary | ICD-10-CM

## 2022-12-28 DIAGNOSIS — I872 Venous insufficiency (chronic) (peripheral): Secondary | ICD-10-CM | POA: Diagnosis not present

## 2022-12-28 NOTE — Progress Notes (Signed)
Requested by:  Dorothyann Peng, MD [address] [place],  [place] [address]  Reason for consultation: left ankle edema    History of Present Illness   Amy Greer is a 67 y.o. (1955-12-03) female who presents for evaluation of left lower extremity swelling.  She endorses a multiple year history of left lower extremity swelling, usually at the level of the ankle.  Occasionally she will get swelling from her knee down.  Most of the time her swelling is the worst at the end of the day.  She works at the [employer] and does a mixture of sitting, standing, and walking.  She denies any noticeable swelling of her right leg.  Over the past 6 months she has noticed some darkening areas of her skin around her ankle on the left leg.  Sometimes the skin around her left medial ankle is tender to touch.  She endorses heavy feeling in the left leg when it is more swollen.  She denies any history of DVT or previous vein procedures.  She denies any history of bleeding episodes or ulcerations.  She has tried knee-high compression stockings and experience some benefit from them, however she is not sure if they are the right size.   Past Medical History:  Diagnosis Date   GERD (gastroesophageal reflux disease)    Lower extremity edema     Past Surgical History:  Procedure Laterality Date   CHOLECYSTECTOMY      Social History   Socioeconomic History   Marital status: Single    Spouse name: Not on file   Number of children: Not on file   Years of education: Not on file   Highest education level: Not on file  Occupational History   Not on file  Tobacco Use   Smoking status: Former    Years: 14    Types: Cigarettes   Smokeless tobacco: Never   Tobacco comments:    1 year ago  Vaping Use   Vaping Use: Never used  Substance and Sexual Activity   Alcohol use: Yes    Comment: SOCIAL   Drug use: Never   Sexual activity: Not on file  Other Topics Concern   Not on file  Social  History Narrative   Not on file   Social Determinants of Health   Financial Resource Strain: Not on file  Food Insecurity: Not on file  Transportation Needs: Not on file  Physical Activity: Not on file  Stress: Not on file  Social Connections: Not on file  Intimate Partner Violence: Not on file    Family History  Problem Relation Age of Onset   Kidney disease Mother    Hyperlipidemia Mother    Lung cancer Sister    Lung cancer Brother    Ulcers Father     Current Outpatient Medications  Medication Sig Dispense Refill   Ascorbic Acid (VITAMIN C) 1000 MG tablet Take 1,000 mg by mouth as directed. 1-2 TABLETS DAILY     Cholecalciferol (VITAMIN D3) 1000 units CAPS Take by mouth as directed. 2-3 BY MOUTH DAILY     famotidine (PEPCID) 40 MG tablet 1 tab(s) orally once a day (at bedtime) for 90 days     meloxicam (MOBIC) 7.5 MG tablet Take by mouth.     pantoprazole (PROTONIX) 40 MG tablet Take 40 mg by mouth daily.     No current facility-administered medications for this visit.    Allergies  Allergen Reactions   Penicillins  SWELLING OF THE THROAT     REVIEW OF SYSTEMS (negative unless checked):   Cardiac:  []  Chest pain or chest pressure? []  Shortness of breath upon activity? []  Shortness of breath when lying flat? []  Irregular heart rhythm?  Vascular:  []  Pain in calf, thigh, or hip brought on by walking? []  Pain in feet at night that wakes you up from your sleep? []  Blood clot in your veins? [x]  Leg swelling?  Pulmonary:  []  Oxygen at home? []  Productive cough? []  Wheezing?  Neurologic:  []  Sudden weakness in arms or legs? []  Sudden numbness in arms or legs? []  Sudden onset of difficult speaking or slurred speech? []  Temporary loss of vision in one eye? []  Problems with dizziness?  Gastrointestinal:  []  Blood in stool? []  Vomited blood?  Genitourinary:  []  Burning when urinating? []  Blood in urine?  Psychiatric:  []  Major  depression  Hematologic:  []  Bleeding problems? []  Problems with blood clotting?  Dermatologic:  []  Rashes or ulcers?  Constitutional:  []  Fever or chills?  Ear/Nose/Throat:  []  Change in hearing? []  Nose bleeds? []  Sore throat?  Musculoskeletal:  []  Back pain? []  Joint pain? []  Muscle pain?   Physical Examination     Vitals:   12/28/22 1226  BP: 124/80  Pulse: 62  Resp: 20  Temp: 98 F (36.7 C)  SpO2: 97%  Weight: 206 lb (93.4 kg)  Height: 5\' 5"  (1.651 m)   Body mass index is 34.28 kg/m.  General:  WDWN in NAD; vital signs documented above Gait: Not observed HENT: WNL, normocephalic Pulmonary: normal non-labored breathing  Cardiac: regular Abdomen: soft, NT, no masses Skin: without rashes Vascular Exam/Pulses: palpable pedal pulses Extremities: No varicose veins.  Scattered reticular veins of bilateral lower extremities.  1+ edema of left ankle.  Hyperpigmentation of left lateral and medial malleolus.  No ulcerations Musculoskeletal: no muscle wasting or atrophy  Neurologic: A&O X 3;  No focal weakness or paresthesias are detected Psychiatric:  The pt has Normal affect.  Non-invasive Vascular Imaging   LLE Venous Insufficiency Duplex (12/28/2022):  LEFT          Reflux NoRefluxReflux TimeDiameter cmsComments                                  Yes                                            +--------------+---------+------+-----------+------------+----------------+   CFV                    yes   >1 second                                +--------------+---------+------+-----------+------------+----------------+   FV prox       no                                                       +--------------+---------+------+-----------+------------+----------------+   FV mid        no                                                       +--------------+---------+------+-----------+------------+----------------+  FV dist        no                                                       +--------------+---------+------+-----------+------------+----------------+   Popliteal    no                                                       +--------------+---------+------+-----------+------------+----------------+   GSV at [facility]    no                           0.883                       +--------------+---------+------+-----------+------------+----------------+   GSV prox thighno                           0.316                       +--------------+---------+------+-----------+------------+----------------+   GSV mid thigh no                           0.219                       +--------------+---------+------+-----------+------------+----------------+   GSV dist thighno                           0.365                       +--------------+---------+------+-----------+------------+----------------+   GSV at knee             yes    >500 ms     0.196                       +--------------+---------+------+-----------+------------+----------------+   GSV prox calf           yes    >500 ms     0.206                       +--------------+---------+------+-----------+------------+----------------+   SSV Pop Fossa no                           0.175                       +--------------+---------+------+-----------+------------+----------------+   SSV prox calf no                           0.146    chronic  thrombus  +--------------+---------+------+-----------+------------+----------------+   SSV mid calf  no                           0.132    chronic  thrombus  +--------------+---------+------+-----------+------------+----------------+    Medical Decision Making   Amy Greer  Amy Greer is a 67 y.o. female who presents for evaluation of left lower extremity swelling  Based on the venous duplex, there is reflux in the left common  femoral vein and the greater saphenous vein at the knee and proximal calf.  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  The rest of the deep and superficial venous system is competent.  There is no evidence of DVT. The patient has a multiple year history of left lower extremity swelling, particularly at the ankle.  Over the past 6 months she has noted darkening of her skin around the medial and lateral malleolus.  She also experiences occasional tenderness around the left medial ankle The greater saphenous vein is fairly competent until the level of the knee, so she would likely not benefit much from saphenous vein ablation.   She has some edema of her left ankle with evidence of hyperpigmentation of the left lateral and medial malleolus, due to chronic venous insufficiency.  I believe she would greatly benefit from the use of properly fitted compression stockings, elevation, and exercise.  She has been measured for 20 to 30 mmHg knee-high stockings today. She can follow-up with our office as needed    Ernestene Mention, PA-C [facility] Office: [phone]  12/31/2022, 5:03 PM  Call MD: Karin Lieu

## 2023-01-24 ENCOUNTER — Ambulatory Visit (INDEPENDENT_AMBULATORY_CARE_PROVIDER_SITE_OTHER): Payer: BC Managed Care – PPO | Admitting: Family Medicine

## 2023-01-24 ENCOUNTER — Encounter: Payer: Self-pay | Admitting: Internal Medicine

## 2023-01-24 ENCOUNTER — Encounter: Payer: Self-pay | Admitting: Family Medicine

## 2023-01-24 ENCOUNTER — Encounter (HOSPITAL_COMMUNITY): Payer: BC Managed Care – PPO

## 2023-01-24 VITALS — BP 120/84 | HR 64 | Temp 98.1°F | Ht 65.0 in | Wt 204.0 lb

## 2023-01-24 DIAGNOSIS — F321 Major depressive disorder, single episode, moderate: Secondary | ICD-10-CM

## 2023-01-24 DIAGNOSIS — Z6833 Body mass index (BMI) 33.0-33.9, adult: Secondary | ICD-10-CM

## 2023-01-24 DIAGNOSIS — Z0000 Encounter for general adult medical examination without abnormal findings: Secondary | ICD-10-CM

## 2023-01-24 DIAGNOSIS — I872 Venous insufficiency (chronic) (peripheral): Secondary | ICD-10-CM | POA: Diagnosis not present

## 2023-01-24 DIAGNOSIS — F411 Generalized anxiety disorder: Secondary | ICD-10-CM

## 2023-01-24 DIAGNOSIS — Z23 Encounter for immunization: Secondary | ICD-10-CM

## 2023-01-24 DIAGNOSIS — R002 Palpitations: Secondary | ICD-10-CM

## 2023-01-24 DIAGNOSIS — E6609 Other obesity due to excess calories: Secondary | ICD-10-CM

## 2023-01-24 DIAGNOSIS — E559 Vitamin D deficiency, unspecified: Secondary | ICD-10-CM

## 2023-01-24 MED ORDER — SERTRALINE HCL 50 MG PO TABS: 25.0000 mg | ORAL_TABLET | Freq: Every day | ORAL | 0 refills | Status: DC

## 2023-01-24 NOTE — Progress Notes (Signed)
I,[name],acting as a Neurosurgeon for [employer], NP.,have documented all relevant documentation on the behalf of Itay Mella, NP,as directed by  Shanah Guimaraes Moshe Salisbury, NP while in the presence of Adrena Nakamura, NP.   Subjective:     Patient ID: Amy Greer , female    DOB: [birth year] , 67 y.o.   MRN: [id number]   Chief Complaint  Patient presents with  . Annual Exam    HPI  Patient presents today for a physical. Patient reports compliance with her meds. Patient complains of heart palpitations. Patient is concerned about a spot on left ankle. Patient reports she did see a doctor for it and was told it is poor circulation and she was advised to wear compressions.    Past Medical History:  Diagnosis Date  . GERD (gastroesophageal reflux disease)   . Lower extremity edema      Family History  Problem Relation Age of Onset  . Kidney disease Mother   . Hyperlipidemia Mother   . Lung cancer Sister   . Lung cancer Brother   . Ulcers Father      Current Outpatient Medications:  .  Cholecalciferol (VITAMIN D3) 1000 units CAPS, Take by mouth as directed. 2-3 BY MOUTH DAILY, Disp: , Rfl:  .  famotidine (PEPCID) 40 MG tablet, 1 tab(s) orally once a day (at bedtime) for 90 days, Disp: , Rfl:  .  meloxicam (MOBIC) 7.5 MG tablet, Take by mouth., Disp: , Rfl:  .  pantoprazole (PROTONIX) 40 MG tablet, Take 40 mg by mouth daily., Disp: , Rfl:  .  UNABLE TO FIND, Med Name: joint and muscle support 2 caps daily, Disp: , Rfl:  .  Ascorbic Acid (VITAMIN C) 1000 MG tablet, Take 1,000 mg by mouth as directed. 1-2 TABLETS DAILY (Patient not taking: Reported on 01/24/2023), Disp: , Rfl:    Allergies  Allergen Reactions  . Penicillins     SWELLING OF THE THROAT       The patient states she uses {contraceptive methods:5051} for birth control. Last LMP was No LMP recorded. Patient is postmenopausal.. {Dysmenorrhea-menorrhagia:21918}. Negative for: breast discharge, breast lump(s), breast pain  and breast self exam. Associated symptoms include abnormal vaginal bleeding. Pertinent negatives include abnormal bleeding (hematology), anxiety, decreased libido, depression, difficulty falling sleep, dyspareunia, history of infertility, nocturia, sexual dysfunction, sleep disturbances, urinary incontinence, urinary urgency, vaginal discharge and vaginal itching. Diet regular.The patient states her exercise level is    . The patient's tobacco use is:  Social History   Tobacco Use  Smoking Status Former  . Years: 39  . Types: Cigarettes  Smokeless Tobacco Never  Tobacco Comments   1 year ago  . She has been exposed to passive smoke. The patient's alcohol use is:  Social History   Substance and Sexual Activity  Alcohol Use Yes   Comment: SOCIAL  . Additional information: Last pap ***, next one scheduled for ***.    Review of Systems  Constitutional: Negative.   HENT: Negative.    Eyes: Negative.   Respiratory: Negative.    Cardiovascular: Negative.   Gastrointestinal: Negative.        Abd. hernia  Endocrine: Negative.   Genitourinary: Negative.   Musculoskeletal: Negative.   Skin: Negative.   Allergic/Immunologic: Negative.   Neurological: Negative.   Hematological: Negative.   Psychiatric/Behavioral: Negative.       Today's Vitals   01/24/23 1055  BP: 120/84  Pulse: 64  Temp: 98.1 F (36.7 C)  Weight: 204  lb (92.5 kg)  Height: 5\' 5"  (1.651 m)  PainSc: 0-No pain   Body mass index is 33.95 kg/m.  Wt Readings from Last 3 Encounters:  01/24/23 204 lb (92.5 kg)  12/28/22 206 lb (93.4 kg)  12/21/22 180 lb (81.6 kg)     Objective:  Physical Exam Constitutional:      Appearance: Normal appearance.  Cardiovascular:     Rate and Rhythm: Normal rate and regular rhythm.     Pulses: Normal pulses.     Heart sounds: Normal heart sounds.  Pulmonary:     Effort: Pulmonary effort is normal.     Breath sounds: Normal breath sounds.  Abdominal:     General: Bowel  sounds are normal.  Musculoskeletal:        General: Normal range of motion.  Skin:    General: Skin is warm and dry.  Neurological:     General: No focal deficit present.     Mental Status: She is alert and oriented to person, place, and time. Mental status is at baseline.  Psychiatric:        Mood and Affect: Mood normal.        Assessment And Plan:     1. Encounter for general adult medical examination w/o abnormal findings  2. Class 1 obesity due to excess calories with body mass index (BMI) of 33.0 to 33.9 in adult, unspecified whether serious comorbidity present  3. Immunization due  4. Vitamin D deficiency disease    Return for 1 year physical. Patient was given opportunity to ask questions. Patient verbalized understanding of the plan and was able to repeat key elements of the plan. All questions were answered to their satisfaction.   Milanya Sunderland Moshe Salisbury, NP   I, Jaliya Siegmann Moshe Salisbury, NP, have reviewed all documentation for this visit. The documentation on 01/24/23 for the exam, diagnosis, procedures, and orders are all accurate and complete.   THE PATIENT IS ENCOURAGED TO PRACTICE SOCIAL DISTANCING DUE TO THE COVID-19 PANDEMIC.

## 2023-01-24 NOTE — Patient Instructions (Signed)

## 2023-01-25 DIAGNOSIS — F411 Generalized anxiety disorder: Secondary | ICD-10-CM | POA: Insufficient documentation

## 2023-01-25 DIAGNOSIS — I872 Venous insufficiency (chronic) (peripheral): Secondary | ICD-10-CM | POA: Insufficient documentation

## 2023-01-25 DIAGNOSIS — E559 Vitamin D deficiency, unspecified: Secondary | ICD-10-CM | POA: Insufficient documentation

## 2023-01-25 DIAGNOSIS — R002 Palpitations: Secondary | ICD-10-CM | POA: Insufficient documentation

## 2023-01-25 DIAGNOSIS — Z23 Encounter for immunization: Secondary | ICD-10-CM | POA: Insufficient documentation

## 2023-01-25 DIAGNOSIS — F321 Major depressive disorder, single episode, moderate: Secondary | ICD-10-CM | POA: Insufficient documentation

## 2023-01-25 DIAGNOSIS — E6609 Other obesity due to excess calories: Secondary | ICD-10-CM | POA: Insufficient documentation

## 2023-01-25 DIAGNOSIS — Z0000 Encounter for general adult medical examination without abnormal findings: Secondary | ICD-10-CM | POA: Insufficient documentation

## 2023-01-25 LAB — CMP14+EGFR
ALT: 21 IU/L (ref 0–32)
AST: 16 IU/L (ref 0–40)
Albumin/Globulin Ratio: 1.6 (ref 1.2–2.2)
Albumin: 4.3 g/dL (ref 3.9–4.9)
Alkaline Phosphatase: 115 IU/L (ref 44–121)
BUN/Creatinine Ratio: 9 — ABNORMAL LOW (ref 12–28)
BUN: 8 mg/dL (ref 8–27)
Bilirubin Total: 0.4 mg/dL (ref 0.0–1.2)
CO2: 27 mmol/L (ref 20–29)
Calcium: 9.5 mg/dL (ref 8.7–10.3)
Chloride: 101 mmol/L (ref 96–106)
Creatinine, Ser: 0.91 mg/dL (ref 0.57–1.00)
Globulin, Total: 2.7 g/dL (ref 1.5–4.5)
Glucose: 76 mg/dL (ref 70–99)
Potassium: 4 mmol/L (ref 3.5–5.2)
Sodium: 140 mmol/L (ref 134–144)
Total Protein: 7 g/dL (ref 6.0–8.5)
eGFR: 70 mL/min/{1.73_m2} (ref >59)

## 2023-01-25 LAB — LIPID PANEL
Chol/HDL Ratio: 2.6 ratio (ref 0.0–4.4)
Cholesterol, Total: 153 mg/dL (ref 100–199)
HDL: 59 mg/dL (ref >39)
LDL Chol Calc (NIH): 78 mg/dL (ref 0–99)
Triglycerides: 88 mg/dL (ref 0–149)
VLDL Cholesterol Cal: 16 mg/dL (ref 5–40)

## 2023-01-25 LAB — CBC
Hematocrit: 39.9 % (ref 34.0–46.6)
Hemoglobin: 13.6 g/dL (ref 11.1–15.9)
MCH: 30.8 pg (ref 26.6–33.0)
MCHC: 34.1 g/dL (ref 31.5–35.7)
MCV: 90 fL (ref 79–97)
Platelets: 312 10*3/uL (ref 150–450)
RBC: 4.42 x10E6/uL (ref 3.77–5.28)
RDW: 13 % (ref 11.7–15.4)
WBC: 6.3 10*3/uL (ref 3.4–10.8)

## 2023-01-25 LAB — VITAMIN D 25 HYDROXY (VIT D DEFICIENCY, FRACTURES): Vit D, 25-Hydroxy: 75 ng/mL (ref 30.0–100.0)

## 2023-01-25 LAB — TSH: TSH: 2.64 u[IU]/mL (ref 0.450–4.500)

## 2023-01-29 NOTE — Progress Notes (Signed)
Normal labs.

## 2023-02-07 ENCOUNTER — Ambulatory Visit: Payer: BC Managed Care – PPO | Attending: Cardiovascular Disease | Admitting: Cardiovascular Disease

## 2023-02-07 ENCOUNTER — Encounter: Payer: Self-pay | Admitting: Cardiovascular Disease

## 2023-02-07 ENCOUNTER — Ambulatory Visit (INDEPENDENT_AMBULATORY_CARE_PROVIDER_SITE_OTHER): Payer: BC Managed Care – PPO

## 2023-02-07 VITALS — BP 122/80 | HR 70 | Ht 64.0 in | Wt 205.2 lb

## 2023-02-07 DIAGNOSIS — I872 Venous insufficiency (chronic) (peripheral): Secondary | ICD-10-CM

## 2023-02-07 DIAGNOSIS — R002 Palpitations: Secondary | ICD-10-CM | POA: Diagnosis not present

## 2023-02-07 DIAGNOSIS — G4733 Obstructive sleep apnea (adult) (pediatric): Secondary | ICD-10-CM

## 2023-02-07 DIAGNOSIS — E6609 Other obesity due to excess calories: Secondary | ICD-10-CM

## 2023-02-07 DIAGNOSIS — R0602 Shortness of breath: Secondary | ICD-10-CM | POA: Diagnosis not present

## 2023-02-07 DIAGNOSIS — Z6833 Body mass index (BMI) 33.0-33.9, adult: Secondary | ICD-10-CM

## 2023-02-07 NOTE — Assessment & Plan Note (Signed)
BMI of 35.  I am referring her to [facility] diet and wellness for physician facilitated weight loss.

## 2023-02-07 NOTE — Patient Instructions (Addendum)
Medication Instructions:  Your physician recommends that you continue on your current medications as directed. Please refer to the Current Medication list given to you today.  *If you need a refill on your cardiac medications before your next appointment, please call your pharmacy*   Testing/Procedures: Your physician has requested that you have an echocardiogram. Echocardiography is a painless test that uses sound waves to create images of your heart. It provides your doctor with information about the size and shape of your heart and how well your heart's chambers and valves are working. This procedure takes approximately one hour. There are no restrictions for this procedure. Please do NOT wear cologne, perfume, aftershave, or lotions (deodorant is allowed). Please arrive 15 minutes prior to your appointment time. This will take place at [address]   ZIO XT- Long Term Monitor Instructions  Your physician has requested you wear a ZIO patch monitor for 14 days.  This is a single patch monitor. [employer] supplies one patch monitor per enrollment. Additional stickers are not available. Please do not apply patch if you will be having a Nuclear Stress Test,  Echocardiogram, Cardiac CT, MRI, or Chest Xray during the period you would be wearing the  monitor. The patch cannot be worn during these tests. You cannot remove and re-apply the  ZIO XT patch monitor.  Your ZIO patch monitor will be mailed 3 day USPS to your address on file. It may take 3-5 days  to receive your monitor after you have been enrolled.  Once you have received your monitor, please review the enclosed instructions. Your monitor  has already been registered assigning a specific monitor serial # to you.  Billing and Patient Assistance Program Information  We have supplied [employer] with any of your insurance information on file for billing purposes. [employer] offers a sliding scale Patient Assistance Program for  patients that do not have  insurance, or whose insurance does not completely cover the cost of the ZIO monitor.  You must apply for the Patient Assistance Program to qualify for this discounted rate.  To apply, please call [employer] at [phone], select option 4, select option 2, ask to apply for  Patient Assistance Program. Meredeth Ide will ask your household income, and how many people  are in your household. They will quote your out-of-pocket cost based on that information.  [employer] will also be able to set up a 59-month, interest-free payment plan if needed.  Applying the monitor   Shave hair from upper left chest.  Hold abrader disc by orange tab. Rub abrader in 40 strokes over the upper left chest as  indicated in your monitor instructions.  Clean area with 4 enclosed alcohol pads. Let dry.  Apply patch as indicated in monitor instructions. Patch will be placed under collarbone on left  side of chest with arrow pointing upward.  Rub patch adhesive wings for 2 minutes. Remove white label marked "1". Remove the white  label marked "2". Rub patch adhesive wings for 2 additional minutes.  While looking in a mirror, press and release button in center of patch. A small green light will  flash 3-4 times. This will be your only indicator that the monitor has been turned on.  Do not shower for the first 24 hours. You may shower after the first 24 hours.  Press the button if you feel a symptom. You will hear a small click. Record Date, Time and  Symptom in the Patient Logbook.  When you  are ready to remove the patch, follow instructions on the last 2 pages of Patient  Logbook. Stick patch monitor onto the last page of Patient Logbook.  Place Patient Logbook in the blue and white box. Use locking tab on box and tape box closed  securely. The blue and white box has prepaid postage on it. Please place it in the mailbox as  soon as possible. Your physician should have your test results approximately  7 days after the  monitor has been mailed back to [facility].  Call [facility] Customer Care at [phone] if you have questions regarding  your ZIO XT patch monitor. Call them immediately if you see an orange light blinking on your  monitor.  If your monitor falls off in less than 4 days, contact our Monitor department at [phone].  If your monitor becomes loose or falls off after 4 days call [employer] at [phone] for  suggestions on securing your monitor    WatchPAT?  Is a FDA cleared portable home sleep study test that uses a watch and 3 points of contact to monitor 7 different channels, including your heart rate, oxygen saturations, body position, snoring, and chest motion.  The study is easy to use from the comfort of your own home and accurately detect sleep apnea.  Before bed, you attach the chest sensor, attached the sleep apnea bracelet to your nondominant hand, and attach the finger probe.  After the study, the raw data is downloaded from the watch and scored for apnea events.   For more information: [email or web address]  Patient Testing Instructions:  Do not put battery into the device until bedtime when you are ready to begin the test. Please call the support number if you need assistance after following the instructions below: 24 hour support line- [phone] or [facility] support at [phone] (option 2)  Download the IntelWatchPAT One" app through the google play store or App Store  Be sure to turn on or enable access to bluetooth in settlings on your smartphone/ device  Make sure no other bluetooth devices are on and within the vicinity of your smartphone/ device and WatchPAT watch during testing.  Make sure to leave your smart phone/ device plugged in and charging all night.  When ready for bed:  Follow the instructions step by step in the WatchPAT One App to activate the testing device. For additional instructions, including video  instruction, visit the WatchPAT One video on Youtube. You can search for WatchPat One within Youtube (video is 4 minutes and 18 seconds) or enter: https://youtube/watch?v=BCce_vbiwxE Please note: You will be prompted to enter a Pin to connect via bluetooth when starting the test. The PIN will be assigned to you when you receive the test.  The device is disposable, but it recommended that you retain the device until you receive a call letting you know the study has been received and the results have been interpreted.  We will let you know if the study did not transmit to [place] properly after the test is completed. You do not need to call us to confirm the receipt of the test.  Please complete the test within 48 hours of receiving PIN.   Frequently Asked Questions:  What is Watch Dennie Bible one?  A single use fully disposable home sleep apnea testing device and will not need to be returned after completion.  What are the requirements to use WatchPAT one?  The be able to have a successful watchpat one sleep study, you should have  your Watch pat one device, your smart phone, watch pat one app, your PIN number and Internet access What type of phone do I need?  You should have a smart phone that uses Android 5.1 and above or any Iphone with IOS 10 and above How can I download the WatchPAT one app?  Based on your device type search for WatchPAT one app either in google play for android devices or APP store for Iphone's Where will I get my PIN for the study?  Your PIN will be provided by your physician's office. It is used for authentication and if you lose/forget your PIN, please reach out to your providers office.  I do not have Internet at home. Can I do WatchPAT one study?  WatchPAT One needs Internet connection throughout the night to be able to transmit the sleep data. You can use your home/local internet or your cellular's data package. However, it is always recommended to use home/local Internet. It is  estimated that between 20MB-30MB will be used with each study.However, the application will be looking for space in the phone to start the study.  What happens if I lose internet or bluetooth connection?  During the internet disconnection, your phone will not be able to transmit the sleep data. All the data, will be stored in your phone. As soon as the internet connection is back on, the phone will being sending the sleep data. During the bluetooth disconnection, WatchPAT one will not be able to to send the sleep data to your phone. Data will be kept in the [facility] one until two devices have bluetooth connection back on. As soon as the connection is back on, WatchPAT one will send the sleep data to the phone.  How long do I need to wear the WatchPAT one?  After you start the study, you should wear the device at least 6 hours.  How far should I keep my phone from the device?  During the night, your phone should be within 15 feet.  What happens if I leave the room for restroom or other reasons?  Leaving the room for any reason will not cause any problem. As soon as your get back to the room, both devices will reconnect and will continue to send the sleep data. Can I use my phone during the sleep study?  Yes, you can use your phone as usual during the study. But it is recommended to put your watchpat one on when you are ready to go to bed.  How will I get my study results?  A soon as you completed your study, your sleep data will be sent to the provider. They will then share the results with you when they are ready.     Follow-Up: At [facility], you and your health needs are our priority.  As part of our continuing mission to provide you with exceptional heart care, we have created designated Provider Care Teams.  These Care Teams include your primary Cardiologist (physician) and Advanced Practice Providers (APPs -  Physician Assistants and Nurse Practitioners) who all work together to  provide you with the care you need, when you need it.  We recommend signing up for the patient portal called "MyChart".  Sign up information is provided on this After Visit Summary.  MyChart is used to connect with patients for Virtual Visits (Telemedicine).  Patients are able to view lab/test results, encounter notes, upcoming appointments, etc.  Non-urgent messages can be sent to your provider  as well.   To learn more about what you can do with MyChart, go to [email or web address].    Your next appointment:   3 month(s)  Provider:   Micah Flesher, PA-C, Marjie Skiff, PA-C, Juanda Crumble, PA-C, Joni Reining, DNP, ANP, Azalee Course, PA-C, or Bernadene Person, NP       Then, Nanetta Batty, MD  will plan to see you again in 6 month(s).

## 2023-02-07 NOTE — Assessment & Plan Note (Signed)
Patient was seen at [facility] regional ER 12/21/2022 with palpitations.  By the time she got there they had but resolved spontaneously.  She has had no recurrence.  She does admit to being under a lot of stress and anxiety at work.  She drinks a couple coffee in the morning and several cans of soft drinks during the day.  I told her to limit her caffeine intake.  I am going to obtain a 2D echo and a 2-week Zio patch to further evaluate.

## 2023-02-07 NOTE — Assessment & Plan Note (Signed)
History of dyspnea on exertion.  She has smoked remotely.  I suspect it is related to deconditioning and obesity.  I am going to get a 2D echo to further evaluate.

## 2023-02-07 NOTE — Progress Notes (Unsigned)
Enrolled patient for a 14 day Zio XT  monitor to be mailed to patients home  °

## 2023-02-07 NOTE — Assessment & Plan Note (Signed)
Left lower extremity edema probably related to venous insufficiency.  She did have a venous Doppler study performed 12/28/2022 which confirmed venous reflux.  She would benefit from being referred to vein and vascular surgery for consideration of venous ablation.

## 2023-02-07 NOTE — Assessment & Plan Note (Signed)
Patient has obesity, nocturnal snoring and daytime somnolence.  Suspect she has sleep apnea.  This could be contributing to her palpitations.  I am going to geta [facility]  home sleep study to further evaluate

## 2023-02-07 NOTE — Progress Notes (Signed)
02/07/2023 Amy Greer   August 03, 1956  [id number]  Primary Physician Amy Peng, MD Primary Cardiologist: Amy Gess MD Amy Greer, [place]  HPI:  Amy Greer is a 67 y.o. moderately overweight married African-American female mother of 3 children, grandmother of 5 grandchildren he was a Scientific laboratory technician and was referred by Dr. Allyne Greer, her PCP, for evaluation of palpitations.  She really has no cardiac risk factors.  She is under a lot of stress and has anxiety at work.  She drinks 1 cup of coffee in the morning and 1 to 2 cups of soft drinks a day.  She does have symptoms compatible with obstructive sleep apnea.  She complains of dyspnea on exertion and is moderately overweight.  She was seen in the ER in [facility] 12/21/2022 with tachypalpitations that resolved spontaneously by the time she arrived.  Her workup was unrevealing.  She also has chronic left lower extremity edema with recent venous duplex performed 12/28/2022 that showed venous reflux on the left.   Current Meds  Medication Sig   Ascorbic Acid (VITAMIN C) 1000 MG tablet Take 1,000 mg by mouth as directed. 1-2 TABLETS DAILY   Cholecalciferol (VITAMIN D3) 1000 units CAPS Take by mouth as directed. 2-3 BY MOUTH DAILY   famotidine (PEPCID) 40 MG tablet 1 tab(s) orally once a day (at bedtime) for 90 days   meloxicam (MOBIC) 7.5 MG tablet Take 7.5 mg by mouth daily.   sertraline (ZOLOFT) 50 MG tablet Take 0.5 tablets (25 mg total) by mouth at bedtime.   UNABLE TO FIND Med Name: joint and muscle support 2 caps daily     Allergies  Allergen Reactions   Penicillins     SWELLING OF THE THROAT     Social History   Socioeconomic History   Marital status: Single    Spouse name: Not on file   Number of children: Not on file   Years of education: Not on file   Highest education level: Not on file  Occupational History   Not on file  Tobacco Use   Smoking status: Former    Years: 14     Types: Cigarettes   Smokeless tobacco: Never   Tobacco comments:    1 year ago  Vaping Use   Vaping Use: Never used  Substance and Sexual Activity   Alcohol use: Yes    Comment: SOCIAL   Drug use: Never   Sexual activity: Not on file  Other Topics Concern   Not on file  Social History Narrative   Not on file   Social Determinants of Health   Financial Resource Strain: Not on file  Food Insecurity: Not on file  Transportation Needs: Not on file  Physical Activity: Not on file  Stress: Not on file  Social Connections: Not on file  Intimate Partner Violence: Not on file     Review of Systems: General: negative for chills, fever, night sweats or weight changes.  Cardiovascular: negative for chest pain, dyspnea on exertion, edema, orthopnea, palpitations, paroxysmal nocturnal dyspnea or shortness of breath Dermatological: negative for rash Respiratory: negative for cough or wheezing Urologic: negative for hematuria Abdominal: negative for nausea, vomiting, diarrhea, bright red blood per rectum, melena, or hematemesis Neurologic: negative for visual changes, syncope, or dizziness All other systems reviewed and are otherwise negative except as noted above.    Blood pressure 122/80, pulse 70, height 5\' 4"  (1.626 m), weight 205 lb 3.2 oz (93.1 kg),  SpO2 98 %.  General appearance: alert and no distress Neck: no adenopathy, no carotid bruit, no JVD, supple, symmetrical, trachea midline, and thyroid not enlarged, symmetric, no tenderness/mass/nodules Lungs: clear to auscultation bilaterally Heart: regular rate and rhythm, S1, S2 normal, no murmur, click, rub or gallop Extremities: extremities normal, atraumatic, no cyanosis or edema Pulses: 2+ and symmetric Skin: Skin color, texture, turgor normal. No rashes or lesions Neurologic: Grossly normal  EKG sinus rhythm at 70 without ST or T wave changes.  Personally reviewed this EKG.  ASSESSMENT AND PLAN:   DYSPNEA History of  dyspnea on exertion.  She has smoked remotely.  I suspect it is related to deconditioning and obesity.  I am going to get a 2D echo to further evaluate.  Palpitation Patient was seen at [facility] regional ER 12/21/2022 with palpitations.  By the time she got there they had but resolved spontaneously.  She has had no recurrence.  She does admit to being under a lot of stress and anxiety at work.  She drinks a couple coffee in the morning and several cans of soft drinks during the day.  I told her to limit her caffeine intake.  I am going to obtain a 2D echo and a 2-week Zio patch to further evaluate.  Chronic venous insufficiency of lower extremity Left lower extremity edema probably related to venous insufficiency.  She did have a venous Doppler study performed 12/28/2022 which confirmed venous reflux.  She would benefit from being referred to vein and vascular surgery for consideration of venous ablation.  Class 1 obesity due to excess calories with body mass index (BMI) of 33.0 to 33.9 in adult BMI of 35.  I am referring her to [facility] diet and wellness for physician facilitated weight loss.  Obstructive sleep apnea Patient has obesity, nocturnal snoring and daytime somnolence.  Suspect she has sleep apnea.  This could be contributing to her palpitations.  I am going to geta [facility]  home sleep study to further evaluate     Amy Gess MD [facility], [facility] 02/07/2023 4:34 PM

## 2023-02-10 DIAGNOSIS — G4733 Obstructive sleep apnea (adult) (pediatric): Secondary | ICD-10-CM

## 2023-02-10 DIAGNOSIS — R0602 Shortness of breath: Secondary | ICD-10-CM

## 2023-02-10 DIAGNOSIS — Z6833 Body mass index (BMI) 33.0-33.9, adult: Secondary | ICD-10-CM

## 2023-02-10 DIAGNOSIS — R002 Palpitations: Secondary | ICD-10-CM | POA: Diagnosis not present

## 2023-02-10 DIAGNOSIS — E6609 Other obesity due to excess calories: Secondary | ICD-10-CM | POA: Diagnosis not present

## 2023-02-10 DIAGNOSIS — I872 Venous insufficiency (chronic) (peripheral): Secondary | ICD-10-CM | POA: Diagnosis not present

## 2023-02-14 ENCOUNTER — Telehealth: Payer: Self-pay

## 2023-02-14 NOTE — Telephone Encounter (Signed)
Patient called regarding Pin for Amy Greer. Notified patient that insurance is still being processed for approval and I will call her once we receive an answer. All questions were answered and patient verbalized understanding.

## 2023-03-09 ENCOUNTER — Other Ambulatory Visit (HOSPITAL_COMMUNITY): Payer: BC Managed Care – PPO

## 2023-03-12 ENCOUNTER — Ambulatory Visit (HOSPITAL_COMMUNITY): Payer: BC Managed Care – PPO | Attending: Cardiology

## 2023-03-12 DIAGNOSIS — R002 Palpitations: Secondary | ICD-10-CM | POA: Diagnosis not present

## 2023-03-12 DIAGNOSIS — R0602 Shortness of breath: Secondary | ICD-10-CM | POA: Diagnosis not present

## 2023-03-12 DIAGNOSIS — I872 Venous insufficiency (chronic) (peripheral): Secondary | ICD-10-CM

## 2023-03-12 DIAGNOSIS — G4733 Obstructive sleep apnea (adult) (pediatric): Secondary | ICD-10-CM | POA: Diagnosis present

## 2023-03-12 DIAGNOSIS — E6609 Other obesity due to excess calories: Secondary | ICD-10-CM

## 2023-03-12 DIAGNOSIS — Z6833 Body mass index (BMI) 33.0-33.9, adult: Secondary | ICD-10-CM | POA: Insufficient documentation

## 2023-03-12 LAB — ECHOCARDIOGRAM COMPLETE
Area-P 1/2: 3.39 cm2
S' Lateral: 2.3 cm

## 2023-05-17 NOTE — Progress Notes (Deleted)
Cardiology Clinic Note   Patient Name: Amy Greer Date of Encounter: 05/17/2023  Primary Care Provider:  Dorothyann Peng, MD Primary Cardiologist:  Nanetta Batty, MD  Patient Profile    67 year old female with history of tachypalpitations, chronic lower extremity edema with venous reflux on the left per Doppler studies 12/28/2022, chronic dyspnea on exertion likely related to deconditioning and obesity.  OSA has not been yet tested for sleep apnea.  Most recent echocardiogram 03/12/2023 revealing normal LV systolic function EF 66 65%, grade 1 diastolic dysfunction with mild concentric left ventricular hypertrophy, left atrial size is moderately dilated, right atrial size with mild to moderately dilated.  No valvular abnormalities..  Past Medical History    Past Medical History:  Diagnosis Date   GERD (gastroesophageal reflux disease)    Lower extremity edema    Past Surgical History:  Procedure Laterality Date   CHOLECYSTECTOMY      Allergies  Allergies  Allergen Reactions   Penicillins     SWELLING OF THE THROAT     History of Present Illness    ***  Home Medications    Current Outpatient Medications  Medication Sig Dispense Refill   Ascorbic Acid (VITAMIN C) 1000 MG tablet Take 1,000 mg by mouth as directed. 1-2 TABLETS DAILY     Cholecalciferol (VITAMIN D3) 1000 units CAPS Take by mouth as directed. 2-3 BY MOUTH DAILY     famotidine (PEPCID) 40 MG tablet 1 tab(s) orally once a day (at bedtime) for 90 days     meloxicam (MOBIC) 7.5 MG tablet Take 7.5 mg by mouth daily.     sertraline (ZOLOFT) 50 MG tablet Take 0.5 tablets (25 mg total) by mouth at bedtime. 30 tablet 0   UNABLE TO FIND Med Name: joint and muscle support 2 caps daily     No current facility-administered medications for this visit.     Family History    Family History  Problem Relation Age of Onset   Kidney disease Mother    Hyperlipidemia Mother    Lung cancer Sister    Lung cancer  Brother    Ulcers Father    She indicated that her mother is deceased. She indicated that her father is deceased. She indicated that her sister is deceased. She indicated that her brother is deceased.  Social History    Social History   Socioeconomic History   Marital status: Single    Spouse name: Not on file   Number of children: Not on file   Years of education: Not on file   Highest education level: Not on file  Occupational History   Not on file  Tobacco Use   Smoking status: Former    Types: Cigarettes   Smokeless tobacco: Never   Tobacco comments:    1 year ago  Vaping Use   Vaping status: Never Used  Substance and Sexual Activity   Alcohol use: Yes    Comment: SOCIAL   Drug use: Never   Sexual activity: Not on file  Other Topics Concern   Not on file  Social History Narrative   Not on file   Social Determinants of Health   Financial Resource Strain: Not on file  Food Insecurity: Not on file  Transportation Needs: Not on file  Physical Activity: Not on file  Stress: Not on file  Social Connections: Unknown (01/09/2022)   Received from [facility], [facility]   Social Network    Social Network: Not on file  Intimate  Partner Violence: Unknown (12/01/2021)   Received from [facility], [facility]   HITS    Physically Hurt: Not on file    Insult or Talk Down To: Not on file    Threaten Physical Harm: Not on file    Scream or Curse: Not on file     Review of Systems    General:  No chills, fever, night sweats or weight changes.  Cardiovascular:  No chest pain, dyspnea on exertion, edema, orthopnea, palpitations, paroxysmal nocturnal dyspnea. Dermatological: No rash, lesions/masses Respiratory: No cough, dyspnea Urologic: No hematuria, dysuria Abdominal:   No nausea, vomiting, diarrhea, bright red blood per rectum, melena, or hematemesis Neurologic:  No visual changes, wkns, changes in mental status. All other systems reviewed and are otherwise  negative except as noted above.       Physical Exam    VS:  There were no vitals taken for this visit. , BMI There is no height or weight on file to calculate BMI. STOP-Bang Score:  5  { Consider Dx Sleep Disordered Breathing or Sleep Apnea  ICD G47.33          :1}    GEN: Well nourished, well developed, in no acute distress. HEENT: normal. Neck: Supple, no JVD, carotid bruits, or masses. Cardiac: RRR, no murmurs, rubs, or gallops. No clubbing, cyanosis, edema.  Radials/DP/PT 2+ and equal bilaterally.  Respiratory:  Respirations regular and unlabored, clear to auscultation bilaterally. GI: Soft, nontender, nondistended, BS + x 4. MS: no deformity or atrophy. Skin: warm and dry, no rash. Neuro:  Strength and sensation are intact. Psych: Normal affect.      Lab Results  Component Value Date   WBC 6.3 01/24/2023   HGB 13.6 01/24/2023   HCT 39.9 01/24/2023   MCV 90 01/24/2023   PLT 312 01/24/2023   Lab Results  Component Value Date   CREATININE 0.91 01/24/2023   BUN 8 01/24/2023   NA 140 01/24/2023   K 4.0 01/24/2023   CL 101 01/24/2023   CO2 27 01/24/2023   Lab Results  Component Value Date   ALT 21 01/24/2023   AST 16 01/24/2023   ALKPHOS 115 01/24/2023   BILITOT 0.4 01/24/2023   Lab Results  Component Value Date   CHOL 153 01/24/2023   HDL 59 01/24/2023   LDLCALC 78 01/24/2023   TRIG 88 01/24/2023   CHOLHDL 2.6 01/24/2023    Lab Results  Component Value Date   HGBA1C 5.5 10/07/2020     Review of Prior Studies      Assessment & Plan   1.  ***     {Are you ordering a CV Procedure (e.g. stress test, cath, DCCV, TEE, etc)?   Press F2        :[id number]}   Signed, Bettey Mare. Liborio Nixon, ANP, AACC   05/17/2023 12:59 PM      Office [phone] Fax [phone]  Notice: This dictation was prepared with Dragon dictation along with smaller phrase technology. Any transcriptional errors that result from this process are unintentional and may not  be corrected upon review.

## 2023-05-18 ENCOUNTER — Ambulatory Visit: Payer: BC Managed Care – PPO | Attending: Adult Health | Admitting: Adult Health

## 2023-05-31 ENCOUNTER — Other Ambulatory Visit: Payer: Self-pay | Admitting: Internal Medicine

## 2023-05-31 ENCOUNTER — Other Ambulatory Visit: Payer: Self-pay | Admitting: Family Medicine

## 2023-05-31 DIAGNOSIS — F411 Generalized anxiety disorder: Secondary | ICD-10-CM

## 2023-05-31 DIAGNOSIS — F321 Major depressive disorder, single episode, moderate: Secondary | ICD-10-CM

## 2023-06-27 MED ORDER — FAMOTIDINE 40 MG PO TABS: 40.0000 mg | ORAL_TABLET | Freq: Every day | ORAL | 2 refills | Status: DC

## 2023-07-13 MED ORDER — SERTRALINE HCL 50 MG PO TABS
25.0000 mg | ORAL_TABLET | Freq: Every day | ORAL | 0 refills | Status: DC
Start: 2023-07-13 — End: 2023-12-12

## 2023-07-31 LAB — HM MAMMOGRAPHY

## 2023-08-24 ENCOUNTER — Ambulatory Visit: Payer: Self-pay | Admitting: Cardiovascular Disease

## 2023-08-24 ENCOUNTER — Encounter: Payer: Self-pay | Admitting: Internal Medicine

## 2023-10-16 ENCOUNTER — Ambulatory Visit: Payer: Self-pay | Admitting: Cardiovascular Disease

## 2023-10-23 ENCOUNTER — Ambulatory Visit: Payer: PRIVATE HEALTH INSURANCE | Admitting: Internal Medicine

## 2023-11-01 ENCOUNTER — Ambulatory Visit: Payer: PRIVATE HEALTH INSURANCE | Admitting: Internal Medicine

## 2023-11-01 NOTE — Progress Notes (Deleted)
 I,Tiffiny Worthy T Deloria Lair, CMA,acting as a Neurosurgeon for Gwynneth Aliment, MD.,have documented all relevant documentation on the behalf of Gwynneth Aliment, MD,as directed by  Gwynneth Aliment, MD while in the presence of Gwynneth Aliment, MD.  Subjective:  Patient ID: Amy Greer , female    DOB: March 01, 1956 , 68 y.o.   MRN: [id number]  No chief complaint on file.   HPI  Patient presents today for Zoloft follow up.   Anxiety Presents for initial visit. Onset was 6 to 12 months ago. The problem has been gradually worsening. Symptoms include depressed mood and excessive worry. Patient reports no suicidal ideas. Symptoms occur constantly. The severity of symptoms is severe and causing significant distress. The symptoms are aggravated by work stress (Patient wants some days off from work, to enable her relax and rest. Will give her the next 2 days off from work.). The quality of sleep is poor.   Past treatments include nothing.     Past Medical History:  Diagnosis Date  . GERD (gastroesophageal reflux disease)   . Lower extremity edema      Family History  Problem Relation Age of Onset  . Kidney disease Mother   . Hyperlipidemia Mother   . Lung cancer Sister   . Lung cancer Brother   . Ulcers Father      Current Outpatient Medications:  .  Ascorbic Acid (VITAMIN C) 1000 MG tablet, Take 1,000 mg by mouth as directed. 1-2 TABLETS DAILY, Disp: , Rfl:  .  Cholecalciferol (VITAMIN D3) 1000 units CAPS, Take by mouth as directed. 2-3 BY MOUTH DAILY, Disp: , Rfl:  .  famotidine (PEPCID) 40 MG tablet, Take 1 tablet (40 mg total) by mouth at bedtime., Disp: 90 tablet, Rfl: 2 .  meloxicam (MOBIC) 7.5 MG tablet, Take 7.5 mg by mouth daily., Disp: , Rfl:  .  sertraline (ZOLOFT) 50 MG tablet, Take 0.5 tablets (25 mg total) by mouth at bedtime., Disp: 30 tablet, Rfl: 0 .  UNABLE TO FIND, Med Name: joint and muscle support 2 caps daily, Disp: , Rfl:    Allergies  Allergen Reactions  . Penicillins      SWELLING OF THE THROAT      Review of Systems  Constitutional: Negative.   Respiratory: Negative.    Cardiovascular: Negative.   Neurological: Negative.   Psychiatric/Behavioral: Negative.  Negative for suicidal ideas.      There were no vitals filed for this visit. There is no height or weight on file to calculate BMI.  Wt Readings from Last 3 Encounters:  02/07/23 205 lb 3.2 oz (93.1 kg)  01/24/23 204 lb (92.5 kg)  12/28/22 206 lb (93.4 kg)     Objective:  Physical Exam      Assessment And Plan:  Current moderate episode of major depressive disorder, unspecified whether recurrent (HCC)  Chronic venous insufficiency of lower extremity  Vitamin D deficiency disease  GAD (generalized anxiety disorder)     No follow-ups on file.  Patient was given opportunity to ask questions. Patient verbalized understanding of the plan and was able to repeat key elements of the plan. All questions were answered to their satisfaction.  Gwynneth Aliment, MD  I, Gwynneth Aliment, MD, have reviewed all documentation for this visit. The documentation on 11/01/23 for the exam, diagnosis, procedures, and orders are all accurate and complete.   IF YOU HAVE BEEN REFERRED TO A SPECIALIST, IT MAY TAKE 1-2 WEEKS TO SCHEDULE/PROCESS THE REFERRAL. IF  YOU HAVE NOT HEARD FROM US/SPECIALIST IN TWO WEEKS, PLEASE GIVE [place] A CALL AT [phone].   THE PATIENT IS ENCOURAGED TO PRACTICE SOCIAL DISTANCING DUE TO THE COVID-19 PANDEMIC.

## 2023-11-02 ENCOUNTER — Encounter: Payer: Self-pay | Admitting: Gastroenterology

## 2023-11-05 ENCOUNTER — Other Ambulatory Visit: Payer: Self-pay | Admitting: Gastroenterology

## 2023-11-05 DIAGNOSIS — K449 Diaphragmatic hernia without obstruction or gangrene: Secondary | ICD-10-CM

## 2023-11-22 ENCOUNTER — Ambulatory Visit
Admission: RE | Admit: 2023-11-22 | Discharge: 2023-11-22 | Disposition: A | Discharge status: Home / Self Care | Payer: Self-pay | Source: Ambulatory Visit | Attending: Gastroenterology | Admitting: Gastroenterology

## 2023-11-22 DIAGNOSIS — K449 Diaphragmatic hernia without obstruction or gangrene: Secondary | ICD-10-CM

## 2023-12-12 ENCOUNTER — Encounter: Payer: Self-pay | Admitting: Family Medicine

## 2023-12-12 ENCOUNTER — Ambulatory Visit (INDEPENDENT_AMBULATORY_CARE_PROVIDER_SITE_OTHER): Payer: PRIVATE HEALTH INSURANCE | Admitting: Family Medicine

## 2023-12-12 VITALS — BP 120/70 | HR 62 | Temp 98.4°F | Ht 64.0 in | Wt 211.0 lb

## 2023-12-12 DIAGNOSIS — R112 Nausea with vomiting, unspecified: Secondary | ICD-10-CM

## 2023-12-12 DIAGNOSIS — F411 Generalized anxiety disorder: Secondary | ICD-10-CM

## 2023-12-12 DIAGNOSIS — E66812 Obesity, class 2: Secondary | ICD-10-CM | POA: Diagnosis not present

## 2023-12-12 DIAGNOSIS — E6609 Other obesity due to excess calories: Secondary | ICD-10-CM

## 2023-12-12 DIAGNOSIS — Z0289 Encounter for other administrative examinations: Secondary | ICD-10-CM | POA: Insufficient documentation

## 2023-12-12 DIAGNOSIS — K449 Diaphragmatic hernia without obstruction or gangrene: Secondary | ICD-10-CM | POA: Diagnosis not present

## 2023-12-12 DIAGNOSIS — Z6836 Body mass index (BMI) 36.0-36.9, adult: Secondary | ICD-10-CM

## 2023-12-12 MED ORDER — SERTRALINE HCL 50 MG PO TABS: 50.0000 mg | ORAL_TABLET | Freq: Every day | ORAL | 2 refills | Status: DC

## 2023-12-12 NOTE — Progress Notes (Signed)
 I,[name], CMA,acting as a Neurosurgeon for [employer], NP.,have documented all relevant documentation on the behalf of Melodie Spry, NP,as directed by  Melodie Spry, NP while in the presence of Melodie Spry, NP.  Subjective:  Patient ID: Amy Greer , female    DOB: 04-09-1956 , 68 y.o.   MRN: [id number]  Chief Complaint  Patient presents with   FMLA    HPI  Patient is a 68 year old female who presents today for nausea and vomiting ,she has a diagnosis of  hiatal hernia. Patient  states that the hernia is causing her to have a lot of nausea and vomiting and this is making her to miss work a lot because it happens randomly, so her job wants her to get FMLA papers filled out.Patient is followed by dr hung, gastroenterologist, who she reports is going to refer her to general surgery for hernia repair.     Past Medical History:  Diagnosis Date   GERD (gastroesophageal reflux disease)    Lower extremity edema      Family History  Problem Relation Age of Onset   Kidney disease Mother    Hyperlipidemia Mother    Lung cancer Sister    Lung cancer Brother    Ulcers Father      Current Outpatient Medications:    Ascorbic Acid (VITAMIN C) 1000 MG tablet, Take 1,000 mg by mouth as directed. 1-2 TABLETS DAILY, Disp: , Rfl:    Cholecalciferol (VITAMIN D3) 1000 units CAPS, Take by mouth as directed. 2-3 BY MOUTH DAILY, Disp: , Rfl:    famotidine  (PEPCID ) 40 MG tablet, Take 1 tablet (40 mg total) by mouth at bedtime., Disp: 90 tablet, Rfl: 2   sertraline  (ZOLOFT ) 50 MG tablet, Take 1 tablet (50 mg total) by mouth at bedtime., Disp: 30 tablet, Rfl: 2   UNABLE TO FIND, Med Name: joint and muscle support 2 caps daily, Disp: , Rfl:    Allergies  Allergen Reactions   Penicillins     SWELLING OF THE THROAT      Review of Systems  Constitutional: Negative.   HENT: Negative.    Cardiovascular: Negative.   Gastrointestinal:  Positive for nausea and vomiting.   Psychiatric/Behavioral:  The patient is nervous/anxious.      Today's Vitals   12/12/23 0905  BP: 120/70  Pulse: 62  Temp: 98.4 F (36.9 C)  TempSrc: Oral  Weight: 211 lb (95.7 kg)  Height: 5\' 4"  (1.626 m)  PainSc: 3   PainLoc: Abdomen   Body mass index is 36.22 kg/m.  Wt Readings from Last 3 Encounters:  12/12/23 211 lb (95.7 kg)  02/07/23 205 lb 3.2 oz (93.1 kg)  01/24/23 204 lb (92.5 kg)    The 10-year ASCVD risk score (Arnett DK, et al., 2019) is: 5.8%   Values used to calculate the score:     Age: 18 years     Sex: Female     Is Non-Hispanic African American: Yes     Diabetic: No     Tobacco smoker: No     Systolic Blood Pressure: 120 mmHg     Is BP treated: No     HDL Cholesterol: 59 mg/dL     Total Cholesterol: 153 mg/dL  Objective:  Physical Exam HENT:     Head: Normocephalic.  Pulmonary:     Effort: Pulmonary effort is normal.     Breath sounds: Normal breath sounds.  Abdominal:     Palpations: Abdomen is  soft.  Neurological:     General: No focal deficit present.     Mental Status: She is alert and oriented to person, place, and time.  Psychiatric:        Mood and Affect: Mood is anxious and depressed. Affect is tearful.         Assessment And Plan:  Nausea and vomiting, unspecified vomiting type  Hiatal hernia Assessment & Plan: To be referred to general surgery   Encounter for completion of form with patient Assessment & Plan: FMLA forms   GAD (generalized anxiety disorder) -     Sertraline  HCl; Take 1 tablet (50 mg total) by mouth at bedtime.  Dispense: 30 tablet; Refill: 2  Class 2 obesity due to excess calories with body mass index (BMI) of 36.0 to 36.9 in adult, unspecified whether serious comorbidity present Assessment & Plan: She is encouraged to strive for BMI less than 30 to decrease cardiac risk. Advised to aim for at least 150 minutes of exercise per week.      Return if symptoms worsen or fail to improve, for Keep  next appt.  Patient was given opportunity to ask questions. Patient verbalized understanding of the plan and was able to repeat key elements of the plan. All questions were answered to their satisfaction.    I, Melodie Spry, NP, have reviewed all documentation for this visit. The documentation on 12/20/2023 for the exam, diagnosis, procedures, and orders are all accurate and complete.     IF YOU HAVE BEEN REFERRED TO A SPECIALIST, IT MAY TAKE 1-2 WEEKS TO SCHEDULE/PROCESS THE REFERRAL. IF YOU HAVE NOT HEARD FROM US /SPECIALIST IN TWO WEEKS, PLEASE GIVE US  A CALL AT [phone].

## 2023-12-20 NOTE — Assessment & Plan Note (Signed)
 She is encouraged to strive for BMI less than 30 to decrease cardiac risk. Advised to aim for at least 150 minutes of exercise per week.

## 2023-12-20 NOTE — Assessment & Plan Note (Signed)
FMLA forms

## 2023-12-20 NOTE — Assessment & Plan Note (Signed)
 To be referred to general surgery

## 2023-12-21 ENCOUNTER — Other Ambulatory Visit: Payer: Self-pay | Admitting: Family Medicine

## 2023-12-25 ENCOUNTER — Telehealth: Payer: Self-pay

## 2023-12-25 NOTE — Telephone Encounter (Signed)
 I called and left pt another voicemail letting her know her forms have been completed she just needs to come to the office and fill out her portion. YL,RMA

## 2023-12-25 NOTE — Telephone Encounter (Signed)
 Patient return call. Patient made aware and is planning to stop by the office tomorrow to complete form. Time not specified.

## 2023-12-26 ENCOUNTER — Ambulatory Visit: Payer: Self-pay | Admitting: General Surgery

## 2023-12-26 NOTE — H&P (Signed)
 History of Present Illness: Amy Greer is a 68 y.o. female who is seen today as an office consultation at the request of Dr. Tova Fresh for evaluation of New Consultation .     History of Present Illness Amy Greer is a 68 year old female with a large hiatal hernia who presents with dysphagia and reflux symptoms. She was referred by Dr. Tova Fresh for evaluation of her hiatal hernia.   She experiences significant dysphagia, with food often getting stuck in her esophagus, particularly with meats and bread. She manages symptoms by being cautious about food intake. She also has difficulty with liquids, requiring positional adjustments to aid passage.   Severe reflux is present, with regurgitation and a sensation of heat, especially when lying flat. She uses three pillows to sleep and a fan to prevent overheating, which worsens her symptoms.   Daily nausea is a persistent issue. She has not undergone esophageal procedures like dilation, as previous evaluations deemed them unnecessary.   Her past medical history includes laparoscopic gallbladder removal five to six years ago. She denies any history of myocardial infarction or cerebrovascular accidents. She was once thought to have heart palpitations, which she attributes to her hernia.       Review of Systems: A complete review of systems was obtained from the patient.  I have reviewed this information and discussed as appropriate with the patient.  See HPI as well for other ROS.   ROS      Medical History: Past Medical History Past Medical History: Diagnosis Date  Acid reflux    Glaucoma (increased eye pressure)    History of cataract    Vision abnormalities        Problem List Patient Active Problem List Diagnosis  Obstructive sleep apnea  Macular hole, left eye  PCO (posterior capsular opacification), left  Glaucoma suspect of both eyes      Past Surgical History Past Surgical  History: Procedure Laterality Date  CHOLECYSTECTOMY   2019  EXTRACTION CATARACT INTRACAPSULAR W/INSERTION INTRAOCULAR PROSTHESIS Left 09/13/2021   [facility]/ Dr.Shah      Allergies Allergies Allergen Reactions  Penicillins Swelling     SWELLING OF THE THROAT      Medications Ordered Prior to Encounter Current Outpatient Medications on File Prior to Visit Medication Sig Dispense Refill  omeprazole (PRILOSEC) 40 MG DR capsule Take by mouth      pantoprazole (PROTONIX) 40 MG DR tablet 1 tab(s) orally 20 minutes before breakfast for 90 days        No current facility-administered medications on file prior to visit.      Family History Family History Problem Relation Age of Onset  Cataracts Mother    Glaucoma Mother        Tobacco Use History Social History    Tobacco Use Smoking Status Former  Types: Cigarettes Smokeless Tobacco Never      Social History Social History    Socioeconomic History  Marital status: Married Tobacco Use  Smoking status: Former     Types: Cigarettes  Smokeless tobacco: Never Substance and Sexual Activity  Alcohol use: Yes  Drug use: Never    Social Drivers of Health      Received from [employer], [facility]   Social Network Housing Stability: Unknown (11/29/2023)   Housing Stability Vital Sign    Homeless in the Last Year: No      Objective:     Vitals:   12/26/23 1532 BP: 118/72 Pulse: 64 Temp: 36.7 C (98  F) Weight: 95.3 kg (210 lb) Height: 162.6 cm (5\' 4" ) PainSc: 0-No pain   Body mass index is 36.05 kg/m.   Physical Exam    PE:   Constitutional: No acute distress, conversant, appears states age. Eyes: Anicteric sclerae, moist conjunctiva, no lid lag Lungs: Clear to auscultation bilaterally, normal respiratory effort CV: regular rate and rhythm, no murmurs, no peripheral edema, pedal pulses 2+ GI: Soft, no masses or hepatosplenomegaly, non-tender to palpation Skin: No rashes, palpation  reveals normal turgor Psychiatric: appropriate judgment and insight, oriented to person, place, and time     Assessment and Plan: Diagnoses and all orders for this visit:   Diaphragmatic hernia without obstruction or gangrene   Gastroesophageal reflux disease with esophagitis without hemorrhage     Amy Greer is a 68 y.o. female  Will order a CT to eval width of HH    1.  We will proceed to the OR for a robotic hiatal hernia repair with mesh and fundoplication 2. All risks and benefits were discussed with the patient, to generally include infection, bleeding, damage to surrounding structures, possible pneumothorax, and recurrence. Alternatives were offered and described.  All questions were answered and the patient voiced understanding of the procedure and wishes to proceed at this point.

## 2023-12-26 NOTE — H&P (View-Only) (Signed)
 History of Present Illness: Amy Greer is a 69 y.o. female who is seen today as an office consultation at the request of Dr. Tova Fresh for evaluation of New Consultation .     History of Present Illness Amy Greer is a 68 year old female with a large hiatal hernia who presents with dysphagia and reflux symptoms. She was referred by Dr. Tova Fresh for evaluation of her hiatal hernia.   She experiences significant dysphagia, with food often getting stuck in her esophagus, particularly with meats and bread. She manages symptoms by being cautious about food intake. She also has difficulty with liquids, requiring positional adjustments to aid passage.   Severe reflux is present, with regurgitation and a sensation of heat, especially when lying flat. She uses three pillows to sleep and a fan to prevent overheating, which worsens her symptoms.   Daily nausea is a persistent issue. She has not undergone esophageal procedures like dilation, as previous evaluations deemed them unnecessary.   Her past medical history includes laparoscopic gallbladder removal five to six years ago. She denies any history of myocardial infarction or cerebrovascular accidents. She was once thought to have heart palpitations, which she attributes to her hernia.       Review of Systems: A complete review of systems was obtained from the patient.  I have reviewed this information and discussed as appropriate with the patient.  See HPI as well for other ROS.   ROS      Medical History: Past Medical History Past Medical History: Diagnosis Date  Acid reflux    Glaucoma (increased eye pressure)    History of cataract    Vision abnormalities        Problem List Patient Active Problem List Diagnosis  Obstructive sleep apnea  Macular hole, left eye  PCO (posterior capsular opacification), left  Glaucoma suspect of both eyes      Past Surgical History Past Surgical  History: Procedure Laterality Date  CHOLECYSTECTOMY   2019  EXTRACTION CATARACT INTRACAPSULAR W/INSERTION INTRAOCULAR PROSTHESIS Left 09/13/2021   [facility]/ Dr.Shah      Allergies Allergies Allergen Reactions  Penicillins Swelling     SWELLING OF THE THROAT      Medications Ordered Prior to Encounter Current Outpatient Medications on File Prior to Visit Medication Sig Dispense Refill  omeprazole (PRILOSEC) 40 MG DR capsule Take by mouth      pantoprazole (PROTONIX) 40 MG DR tablet 1 tab(s) orally 20 minutes before breakfast for 90 days        No current facility-administered medications on file prior to visit.      Family History Family History Problem Relation Age of Onset  Cataracts Mother    Glaucoma Mother        Tobacco Use History Social History    Tobacco Use Smoking Status Former  Types: Cigarettes Smokeless Tobacco Never      Social History Social History    Socioeconomic History  Marital status: Married Tobacco Use  Smoking status: Former     Types: Cigarettes  Smokeless tobacco: Never Substance and Sexual Activity  Alcohol use: Yes  Drug use: Never    Social Drivers of Health      Received from [facility], [facility]   Social Network Housing Stability: Unknown (11/29/2023)   Housing Stability Vital Sign    Homeless in the Last Year: No      Objective:     Vitals:   12/26/23 1532 BP: 118/72 Pulse: 64 Temp: 36.7 C (98  F) Weight: 95.3 kg (210 lb) Height: 162.6 cm (5\' 4" ) PainSc: 0-No pain   Body mass index is 36.05 kg/m.   Physical Exam    PE:   Constitutional: No acute distress, conversant, appears states age. Eyes: Anicteric sclerae, moist conjunctiva, no lid lag Lungs: Clear to auscultation bilaterally, normal respiratory effort CV: regular rate and rhythm, no murmurs, no peripheral edema, pedal pulses 2+ GI: Soft, no masses or hepatosplenomegaly, non-tender to palpation Skin: No rashes, palpation  reveals normal turgor Psychiatric: appropriate judgment and insight, oriented to person, place, and time     Assessment and Plan: Diagnoses and all orders for this visit:   Diaphragmatic hernia without obstruction or gangrene   Gastroesophageal reflux disease with esophagitis without hemorrhage     Amy Greer is a 68 y.o. female  Will order a CT to eval width of HH    1.  We will proceed to the OR for a robotic hiatal hernia repair with mesh and fundoplication 2. All risks and benefits were discussed with the patient, to generally include infection, bleeding, damage to surrounding structures, possible pneumothorax, and recurrence. Alternatives were offered and described.  All questions were answered and the patient voiced understanding of the procedure and wishes to proceed at this point.

## 2023-12-27 ENCOUNTER — Other Ambulatory Visit: Payer: Self-pay | Admitting: General Surgery

## 2023-12-27 DIAGNOSIS — K449 Diaphragmatic hernia without obstruction or gangrene: Secondary | ICD-10-CM

## 2023-12-27 DIAGNOSIS — K2100 Gastro-esophageal reflux disease with esophagitis, without bleeding: Secondary | ICD-10-CM

## 2024-01-04 ENCOUNTER — Ambulatory Visit
Admission: RE | Admit: 2024-01-04 | Discharge: 2024-01-04 | Disposition: A | Discharge status: Home / Self Care | Payer: Self-pay | Source: Ambulatory Visit | Attending: General Surgery | Admitting: General Surgery

## 2024-01-04 DIAGNOSIS — K449 Diaphragmatic hernia without obstruction or gangrene: Secondary | ICD-10-CM

## 2024-01-04 DIAGNOSIS — K2100 Gastro-esophageal reflux disease with esophagitis, without bleeding: Secondary | ICD-10-CM

## 2024-01-16 NOTE — Pre-Procedure Instructions (Signed)
 Surgical Instructions   Your procedure is scheduled on Jan 22, 2024. Report to [facility] Main Entrance "A" at 5:30 A.M., then check in with the Admitting office. Any questions or running late day of surgery: call [phone]  Questions prior to your surgery date: call [phone], Monday-Friday, 8am-4pm. If you experience any cold or flu symptoms such as cough, fever, chills, shortness of breath, etc. between now and your scheduled surgery, please notify us  at the above number.     Remember:  Do not eat after midnight the night before your surgery   You may drink clear liquids until 4:30 AM the morning of your surgery.   Clear liquids allowed are: Water, Non-Citrus Juices (without pulp), Carbonated Beverages, Clear Tea (no milk, honey, etc.), Black Coffee Only (NO MILK, CREAM OR POWDERED CREAMER of any kind), and Gatorade.  Patient Instructions  The night before surgery:  No food after midnight. ONLY clear liquids after midnight  The day of surgery (if you do NOT have diabetes):  Drink ONE (1) Pre-Surgery Clear Ensure by 4:30 AM the morning of surgery. Drink in one sitting. Do not sip.  This drink was given to you during your hospital  pre-op appointment visit.  Nothing else to drink after completing the  Pre-Surgery Clear Ensure.         If you have questions, please contact your surgeon's office.   Take these medicines the morning of surgery with A SIP OF WATER: NONE   May take these medicines IF NEEDED: carboxymethylcellulose (REFRESH PLUS) eye drops   One week prior to surgery, STOP taking any Aspirin (unless otherwise instructed by your surgeon) Aleve, Naproxen, Ibuprofen, Motrin, Advil, Goody's, BC's, all herbal medications, fish oil, and non-prescription vitamins.                     Do NOT Smoke (Tobacco/Vaping) for 24 hours prior to your procedure.  If you use a CPAP at night, you may bring your mask/headgear for your overnight stay.   You will be asked to  remove any contacts, glasses, piercing's, hearing aid's, dentures/partials prior to surgery. Please bring cases for these items if needed.    Patients discharged the day of surgery will not be allowed to drive home, and someone needs to stay with them for 24 hours.  SURGICAL WAITING ROOM VISITATION Patients may have no more than 2 support people in the waiting area - these visitors may rotate.   Pre-op nurse will coordinate an appropriate time for 1 ADULT support person, who may not rotate, to accompany patient in pre-op.  Children under the age of 65 must have an adult with them who is not the patient and must remain in the main waiting area with an adult.  If the patient needs to stay at the hospital during part of their recovery, the visitor guidelines for inpatient rooms apply.  Please refer to the [facility] website for the visitor guidelines for any additional information.   If you received a COVID test during your pre-op visit  it is requested that you wear a mask when out in public, stay away from anyone that may not be feeling well and notify your surgeon if you develop symptoms. If you have been in contact with anyone that has tested positive in the last 10 days please notify you surgeon.      Pre-operative CHG Bathing Instructions   You can play a key role in reducing the risk of infection after surgery.  Your skin needs to be as free of germs as possible. You can reduce the number of germs on your skin by washing with CHG (chlorhexidine gluconate) soap before surgery. CHG is an antiseptic soap that kills germs and continues to kill germs even after washing.   DO NOT use if you have an allergy to chlorhexidine/CHG or antibacterial soaps. If your skin becomes reddened or irritated, stop using the CHG and notify one of our RNs at [phone].              TAKE A SHOWER THE NIGHT BEFORE SURGERY AND THE DAY OF SURGERY    Please keep in mind the following:  DO NOT shave, including  legs and underarms, 48 hours prior to surgery.   You may shave your face before/day of surgery.  Place clean sheets on your bed the night before surgery Use a clean washcloth (not used since being washed) for each shower. DO NOT sleep with pet's night before surgery.  CHG Shower Instructions:  Wash your face and private area with normal soap. If you choose to wash your hair, wash first with your normal shampoo.  After you use shampoo/soap, rinse your hair and body thoroughly to remove shampoo/soap residue.  Turn the water OFF and apply half the bottle of CHG soap to a CLEAN washcloth.  Apply CHG soap ONLY FROM YOUR NECK DOWN TO YOUR TOES (washing for 3-5 minutes)  DO NOT use CHG soap on face, private areas, open wounds, or sores.  Pay special attention to the area where your surgery is being performed.  If you are having back surgery, having someone wash your back for you may be helpful. Wait 2 minutes after CHG soap is applied, then you may rinse off the CHG soap.  Pat dry with a clean towel  Put on clean pajamas    Additional instructions for the day of surgery: DO NOT APPLY any lotions, deodorants, cologne, or perfumes.   Do not wear jewelry or makeup Do not wear nail polish, gel polish, artificial nails, or any other type of covering on natural nails (fingers and toes) Do not bring valuables to the hospital. [facility] is not responsible for valuables/personal belongings. Put on clean/comfortable clothes.  Please brush your teeth.  Ask your nurse before applying any prescription medications to the skin.

## 2024-01-17 ENCOUNTER — Encounter (HOSPITAL_COMMUNITY): Payer: Self-pay

## 2024-01-17 ENCOUNTER — Other Ambulatory Visit: Payer: Self-pay

## 2024-01-17 ENCOUNTER — Encounter (HOSPITAL_COMMUNITY)
Admission: RE | Admit: 2024-01-17 | Discharge: 2024-01-17 | Disposition: A | Discharge status: Home / Self Care | Source: Ambulatory Visit | Attending: General Surgery | Admitting: General Surgery

## 2024-01-17 VITALS — BP 146/74 | HR 74 | Temp 98.4°F | Resp 17 | Ht 64.0 in | Wt 211.4 lb

## 2024-01-17 DIAGNOSIS — Z01812 Encounter for preprocedural laboratory examination: Secondary | ICD-10-CM | POA: Insufficient documentation

## 2024-01-17 DIAGNOSIS — I2510 Atherosclerotic heart disease of native coronary artery without angina pectoris: Secondary | ICD-10-CM | POA: Diagnosis not present

## 2024-01-17 HISTORY — DX: Cardiac arrhythmia, unspecified: I49.9

## 2024-01-17 HISTORY — DX: Depression, unspecified: F32.A

## 2024-01-17 HISTORY — DX: Pneumonia, unspecified organism: J18.9

## 2024-01-17 HISTORY — DX: Sleep apnea, unspecified: G47.30

## 2024-01-17 HISTORY — DX: Peripheral vascular disease, unspecified: I73.9

## 2024-01-17 HISTORY — DX: Anxiety disorder, unspecified: F41.9

## 2024-01-17 HISTORY — DX: Personal history of other diseases of the digestive system: Z87.19

## 2024-01-17 LAB — BASIC METABOLIC PANEL WITH GFR
Anion gap: 7 (ref 5–15)
BUN: 7 mg/dL — ABNORMAL LOW (ref 8–23)
CO2: 26 mmol/L (ref 22–32)
Calcium: 9 mg/dL (ref 8.9–10.3)
Chloride: 104 mmol/L (ref 98–111)
Creatinine, Ser: 0.84 mg/dL (ref 0.44–1.00)
GFR, Estimated: >60 mL/min (ref >60)
Glucose, Bld: 86 mg/dL (ref 70–99)
Potassium: 3.6 mmol/L (ref 3.5–5.1)
Sodium: 137 mmol/L (ref 135–145)

## 2024-01-17 LAB — CBC
HCT: 41.4 % (ref 36.0–46.0)
Hemoglobin: 14 g/dL (ref 12.0–15.0)
MCH: 31.2 pg (ref 26.0–34.0)
MCHC: 33.8 g/dL (ref 30.0–36.0)
MCV: 92.2 fL (ref 80.0–100.0)
Platelets: 326 10*3/uL (ref 150–400)
RBC: 4.49 MIL/uL (ref 3.87–5.11)
RDW: 12.9 % (ref 11.5–15.5)
WBC: 6.5 10*3/uL (ref 4.0–10.5)
nRBC: 0 % (ref 0.0–0.2)

## 2024-01-17 NOTE — Progress Notes (Signed)
 PCP - Dr. Cleave Curling Cardiologist - Pt saw Dr. Lauro Portal in June 2024 for palpitations. She completed a Zio Patch and echocardiogram with normal results. PRN follow-up.  PPM/ICD - Denies Device Orders - n/a Rep Notified - n/a  Chest x-ray - n/a EKG - 02/07/2023 Stress Test - Denies ECHO - 03/12/2023 Cardiac Cath - Denies  Sleep Study - +OSA. Pt does not wear CPAP  No DM  Last dose of GLP1 agonist- n/a GLP1 instructions: n/a  Blood Thinner Instructions: n/a Aspirin Instructions: n/a  ERAS Protcol - Clear liquids until 0430 morning of surgery  PRE-SURGERY Ensure or G2- Ensure given to pt with instructions  COVID TEST- n/a   Anesthesia review: No  Patient denies shortness of breath, fever, cough and chest pain at PAT appointment. Pt denies any respiratory illness/infection in the last two months.   All instructions explained to the patient, with a verbal understanding of the material. Patient agrees to go over the instructions while at home for a better understanding. Patient also instructed to self quarantine after being tested for COVID-19. The opportunity to ask questions was provided.

## 2024-01-22 ENCOUNTER — Other Ambulatory Visit: Payer: Self-pay

## 2024-01-22 ENCOUNTER — Ambulatory Visit (HOSPITAL_COMMUNITY)

## 2024-01-22 ENCOUNTER — Inpatient Hospital Stay (HOSPITAL_COMMUNITY)
Admission: RE | Admit: 2024-01-22 | Discharge: 2024-01-25 | DRG: 327 | Disposition: A | Discharge status: Home / Self Care | Attending: General Surgery | Admitting: General Surgery

## 2024-01-22 ENCOUNTER — Encounter (HOSPITAL_COMMUNITY)
Admission: RE | Disposition: A | Discharge status: Home / Self Care | Payer: Self-pay | Source: Home / Self Care | Attending: General Surgery

## 2024-01-22 ENCOUNTER — Encounter (HOSPITAL_COMMUNITY): Payer: Self-pay | Admitting: General Surgery

## 2024-01-22 DIAGNOSIS — Z9049 Acquired absence of other specified parts of digestive tract: Secondary | ICD-10-CM

## 2024-01-22 DIAGNOSIS — K449 Diaphragmatic hernia without obstruction or gangrene: Secondary | ICD-10-CM

## 2024-01-22 DIAGNOSIS — Z87891 Personal history of nicotine dependence: Secondary | ICD-10-CM

## 2024-01-22 DIAGNOSIS — E669 Obesity, unspecified: Secondary | ICD-10-CM | POA: Diagnosis present

## 2024-01-22 DIAGNOSIS — Z880 Allergy status to penicillin: Secondary | ICD-10-CM

## 2024-01-22 DIAGNOSIS — X58XXXA Exposure to other specified factors, initial encounter: Secondary | ICD-10-CM | POA: Diagnosis present

## 2024-01-22 DIAGNOSIS — Z6836 Body mass index (BMI) 36.0-36.9, adult: Secondary | ICD-10-CM

## 2024-01-22 DIAGNOSIS — G4733 Obstructive sleep apnea (adult) (pediatric): Secondary | ICD-10-CM | POA: Diagnosis present

## 2024-01-22 DIAGNOSIS — Z931 Gastrostomy status: Principal | ICD-10-CM

## 2024-01-22 DIAGNOSIS — R1310 Dysphagia, unspecified: Secondary | ICD-10-CM | POA: Diagnosis present

## 2024-01-22 DIAGNOSIS — R110 Nausea: Secondary | ICD-10-CM | POA: Diagnosis not present

## 2024-01-22 DIAGNOSIS — K2100 Gastro-esophageal reflux disease with esophagitis, without bleeding: Secondary | ICD-10-CM | POA: Diagnosis present

## 2024-01-22 DIAGNOSIS — I739 Peripheral vascular disease, unspecified: Secondary | ICD-10-CM | POA: Diagnosis present

## 2024-01-22 DIAGNOSIS — Z83511 Family history of glaucoma: Secondary | ICD-10-CM

## 2024-01-22 DIAGNOSIS — S36113A Laceration of liver, unspecified degree, initial encounter: Secondary | ICD-10-CM | POA: Diagnosis present

## 2024-01-22 HISTORY — PX: XI ROBOTIC ASSISTED HIATAL HERNIA REPAIR: SHX6889

## 2024-01-22 SURGERY — REPAIR, HERNIA, HIATAL, ROBOT-ASSISTED
Anesthesia: General

## 2024-01-22 MED ORDER — BUPIVACAINE HCL (PF) 0.25 % IJ SOLN
INTRAMUSCULAR | Status: AC
  Filled 2024-01-22: qty 30

## 2024-01-22 MED ORDER — PHENYLEPHRINE 80 MCG/ML (10ML) SYRINGE FOR IV PUSH (FOR BLOOD PRESSURE SUPPORT)
PREFILLED_SYRINGE | INTRAVENOUS | Status: DC | PRN
  Administered 2024-01-22: 80 ug via INTRAVENOUS

## 2024-01-22 MED ORDER — DEXMEDETOMIDINE HCL IN NACL 80 MCG/20ML IV SOLN
INTRAVENOUS | Status: AC
  Filled 2024-01-22: qty 20

## 2024-01-22 MED ORDER — MIDAZOLAM HCL 2 MG/2ML IJ SOLN
INTRAMUSCULAR | Status: AC
  Filled 2024-01-22: qty 2

## 2024-01-22 MED ORDER — HYDROMORPHONE HCL 1 MG/ML IJ SOLN
INTRAMUSCULAR | Status: AC
  Filled 2024-01-22: qty 0.5

## 2024-01-22 MED ORDER — PHENOL 1.4 % MT LIQD: 1.0000 | OROMUCOSAL | Status: DC | PRN

## 2024-01-22 MED ORDER — PROPOFOL 10 MG/ML IV BOLUS
INTRAVENOUS | Status: AC
  Filled 2024-01-22: qty 20

## 2024-01-22 MED ORDER — OXYCODONE HCL 5 MG/5ML PO SOLN: 5.0000 mg | Freq: Once | ORAL | Status: DC | PRN

## 2024-01-22 MED ORDER — CHLORHEXIDINE GLUCONATE 0.12 % MT SOLN
15.0000 mL | Freq: Once | OROMUCOSAL | Status: AC
  Administered 2024-01-22: 15 mL via OROMUCOSAL
  Filled 2024-01-22: qty 15

## 2024-01-22 MED ORDER — ROCURONIUM BROMIDE 10 MG/ML (PF) SYRINGE
PREFILLED_SYRINGE | INTRAVENOUS | Status: DC | PRN
  Administered 2024-01-22: 50 mg via INTRAVENOUS
  Administered 2024-01-22 (×2): 10 mg via INTRAVENOUS

## 2024-01-22 MED ORDER — DEXAMETHASONE SODIUM PHOSPHATE 10 MG/ML IJ SOLN
INTRAMUSCULAR | Status: AC
  Filled 2024-01-22: qty 1

## 2024-01-22 MED ORDER — HYDROMORPHONE HCL 1 MG/ML IJ SOLN
INTRAMUSCULAR | Status: AC
  Filled 2024-01-22: qty 1

## 2024-01-22 MED ORDER — PROPOFOL 10 MG/ML IV BOLUS
INTRAVENOUS | Status: DC | PRN
Start: 2024-01-22 — End: 2024-01-22
  Administered 2024-01-22: 50 mg via INTRAVENOUS
  Administered 2024-01-22: 150 mg via INTRAVENOUS

## 2024-01-22 MED ORDER — SUCCINYLCHOLINE CHLORIDE 200 MG/10ML IV SOSY
PREFILLED_SYRINGE | INTRAVENOUS | Status: DC | PRN
  Administered 2024-01-22: 120 mg via INTRAVENOUS

## 2024-01-22 MED ORDER — DEXMEDETOMIDINE HCL IN NACL 80 MCG/20ML IV SOLN
INTRAVENOUS | Status: DC | PRN
  Administered 2024-01-22: 8 ug via INTRAVENOUS

## 2024-01-22 MED ORDER — SUGAMMADEX SODIUM 200 MG/2ML IV SOLN
INTRAVENOUS | Status: DC | PRN
  Administered 2024-01-22: 200 mg via INTRAVENOUS

## 2024-01-22 MED ORDER — ONDANSETRON 4 MG PO TBDP
4.0000 mg | ORAL_TABLET | Freq: Four times a day (QID) | ORAL | Status: DC | PRN
Start: 2024-01-22 — End: 2024-01-25

## 2024-01-22 MED ORDER — MIDAZOLAM HCL 2 MG/2ML IJ SOLN
INTRAMUSCULAR | Status: DC | PRN
  Administered 2024-01-22: 2 mg via INTRAVENOUS

## 2024-01-22 MED ORDER — 0.9 % SODIUM CHLORIDE (POUR BTL) OPTIME
TOPICAL | Status: DC | PRN
  Administered 2024-01-22: 1000 mL

## 2024-01-22 MED ORDER — ONDANSETRON HCL 4 MG/2ML IJ SOLN
4.0000 mg | Freq: Four times a day (QID) | INTRAMUSCULAR | Status: DC | PRN
  Administered 2024-01-22 – 2024-01-24 (×3): 4 mg via INTRAVENOUS
  Filled 2024-01-22 (×3): qty 2

## 2024-01-22 MED ORDER — ONDANSETRON HCL 4 MG/2ML IJ SOLN
INTRAMUSCULAR | Status: AC
  Filled 2024-01-22: qty 2

## 2024-01-22 MED ORDER — CHLORHEXIDINE GLUCONATE CLOTH 2 % EX PADS: 6.0000 | MEDICATED_PAD | Freq: Once | CUTANEOUS | Status: DC

## 2024-01-22 MED ORDER — SODIUM CHLORIDE (PF) 0.9 % IJ SOLN
INTRAMUSCULAR | Status: AC
  Filled 2024-01-22: qty 10

## 2024-01-22 MED ORDER — MEPERIDINE HCL 25 MG/ML IJ SOLN: 6.2500 mg | INTRAMUSCULAR | Status: DC | PRN

## 2024-01-22 MED ORDER — ROCURONIUM BROMIDE 10 MG/ML (PF) SYRINGE
PREFILLED_SYRINGE | INTRAVENOUS | Status: AC
  Filled 2024-01-22: qty 10

## 2024-01-22 MED ORDER — SODIUM CHLORIDE 0.9 % IV SOLN: 12.5000 mg | INTRAVENOUS | Status: DC | PRN

## 2024-01-22 MED ORDER — BUPIVACAINE-EPINEPHRINE 0.25% -1:200000 IJ SOLN
INTRAMUSCULAR | Status: DC | PRN
  Administered 2024-01-22: 22 mL

## 2024-01-22 MED ORDER — SODIUM CHLORIDE 0.9 % IV SOLN
INTRAVENOUS | Status: DC | PRN
  Administered 2024-01-22: 40 mL

## 2024-01-22 MED ORDER — OXYCODONE HCL 5 MG PO TABS: 5.0000 mg | ORAL_TABLET | Freq: Once | ORAL | Status: DC | PRN

## 2024-01-22 MED ORDER — LACTATED RINGERS IV SOLN: INTRAVENOUS | Status: DC

## 2024-01-22 MED ORDER — VANCOMYCIN HCL IN DEXTROSE 1-5 GM/200ML-% IV SOLN
1000.0000 mg | INTRAVENOUS | Status: AC
  Administered 2024-01-22: 1000 mg via INTRAVENOUS
  Filled 2024-01-22: qty 200

## 2024-01-22 MED ORDER — ONDANSETRON HCL 4 MG/2ML IJ SOLN
INTRAMUSCULAR | Status: DC | PRN
  Administered 2024-01-22: 4 mg via INTRAVENOUS

## 2024-01-22 MED ORDER — DEXTROSE-SODIUM CHLORIDE 5-0.9 % IV SOLN: INTRAVENOUS | Status: AC

## 2024-01-22 MED ORDER — BUPIVACAINE-EPINEPHRINE (PF) 0.25% -1:200000 IJ SOLN
INTRAMUSCULAR | Status: AC
  Filled 2024-01-22: qty 30

## 2024-01-22 MED ORDER — LABETALOL HCL 5 MG/ML IV SOLN
INTRAVENOUS | Status: DC | PRN
  Administered 2024-01-22: 2.5 mg via INTRAVENOUS

## 2024-01-22 MED ORDER — AMISULPRIDE (ANTIEMETIC) 5 MG/2ML IV SOLN: 10.0000 mg | Freq: Once | INTRAVENOUS | Status: DC | PRN

## 2024-01-22 MED ORDER — BUPIVACAINE LIPOSOME 1.3 % IJ SUSP
INTRAMUSCULAR | Status: AC
  Filled 2024-01-22: qty 20

## 2024-01-22 MED ORDER — FENTANYL CITRATE (PF) 100 MCG/2ML IJ SOLN
INTRAMUSCULAR | Status: AC
  Filled 2024-01-22: qty 2

## 2024-01-22 MED ORDER — HYDROMORPHONE HCL 1 MG/ML IJ SOLN
0.2500 mg | INTRAMUSCULAR | Status: DC | PRN
  Administered 2024-01-22 (×2): 0.5 mg via INTRAVENOUS

## 2024-01-22 MED ORDER — LIDOCAINE HCL (CARDIAC) PF 100 MG/5ML IV SOSY
PREFILLED_SYRINGE | INTRAVENOUS | Status: DC | PRN
  Administered 2024-01-22: 100 mg via INTRATRACHEAL

## 2024-01-22 MED ORDER — LABETALOL HCL 5 MG/ML IV SOLN
INTRAVENOUS | Status: AC
  Filled 2024-01-22: qty 4

## 2024-01-22 MED ORDER — PHENYLEPHRINE 80 MCG/ML (10ML) SYRINGE FOR IV PUSH (FOR BLOOD PRESSURE SUPPORT)
PREFILLED_SYRINGE | INTRAVENOUS | Status: AC
  Filled 2024-01-22: qty 10

## 2024-01-22 MED ORDER — LIDOCAINE 2% (20 MG/ML) 5 ML SYRINGE
INTRAMUSCULAR | Status: DC | PRN
  Administered 2024-01-22: 100 mg via INTRAVENOUS

## 2024-01-22 MED ORDER — ORAL CARE MOUTH RINSE: 15.0000 mL | Freq: Once | OROMUCOSAL | Status: AC

## 2024-01-22 MED ORDER — CHLORHEXIDINE GLUCONATE CLOTH 2 % EX PADS
6.0000 | MEDICATED_PAD | Freq: Once | CUTANEOUS | Status: DC
Start: 2024-01-22 — End: 2024-01-22

## 2024-01-22 MED ORDER — LIDOCAINE 2% (20 MG/ML) 5 ML SYRINGE
INTRAMUSCULAR | Status: AC
  Filled 2024-01-22: qty 5

## 2024-01-22 MED ORDER — DEXAMETHASONE SODIUM PHOSPHATE 10 MG/ML IJ SOLN
INTRAMUSCULAR | Status: DC | PRN
  Administered 2024-01-22: 10 mg via INTRAVENOUS

## 2024-01-22 MED ORDER — ENSURE PRE-SURGERY PO LIQD: 296.0000 mL | Freq: Once | ORAL | Status: DC

## 2024-01-22 MED ORDER — FENTANYL CITRATE (PF) 250 MCG/5ML IJ SOLN
INTRAMUSCULAR | Status: DC | PRN
  Administered 2024-01-22 (×2): 50 ug via INTRAVENOUS

## 2024-01-22 MED ORDER — HYDROCODONE-ACETAMINOPHEN 7.5-325 MG/15ML PO SOLN
10.0000 mL | ORAL | Status: DC | PRN
  Administered 2024-01-23 (×3): 10 mL via ORAL
  Filled 2024-01-22 (×4): qty 15

## 2024-01-22 MED ORDER — SODIUM CHLORIDE (PF) 0.9 % IJ SOLN
INTRAMUSCULAR | Status: AC
  Filled 2024-01-22: qty 20

## 2024-01-22 MED ORDER — ACETAMINOPHEN 500 MG PO TABS
1000.0000 mg | ORAL_TABLET | ORAL | Status: AC
Start: 2024-01-22 — End: 2024-01-22
  Administered 2024-01-22: 1000 mg via ORAL
  Filled 2024-01-22: qty 2

## 2024-01-22 MED ORDER — KETOROLAC TROMETHAMINE 15 MG/ML IJ SOLN
15.0000 mg | Freq: Four times a day (QID) | INTRAMUSCULAR | Status: DC | PRN
  Administered 2024-01-22 – 2024-01-24 (×6): 15 mg via INTRAVENOUS
  Filled 2024-01-22 (×7): qty 1

## 2024-01-22 MED ORDER — HYDROMORPHONE HCL 1 MG/ML IJ SOLN
INTRAMUSCULAR | Status: DC | PRN
  Administered 2024-01-22: .5 mg via INTRAVENOUS

## 2024-01-22 SURGICAL SUPPLY — 51 items
CANNULA REDUCER 12-8 DVNC XI (CANNULA) ×1 IMPLANT
CAUTERY HOOK MNPLR 1.6 DVNC XI (INSTRUMENTS) IMPLANT
CHLORAPREP W/TINT 26 (MISCELLANEOUS) ×1 IMPLANT
CLIP APPLIE 5 13 M/L LIGAMAX5 (MISCELLANEOUS) IMPLANT
COVER MAYO STAND STRL (DRAPES) ×1 IMPLANT
COVER SURGICAL LIGHT HANDLE (MISCELLANEOUS) ×1 IMPLANT
COVER TIP SHEARS 8 DVNC (MISCELLANEOUS) IMPLANT
DEFOGGER SCOPE WARM SEASHARP (MISCELLANEOUS) ×1 IMPLANT
DERMABOND ADVANCED .7 DNX12 (GAUZE/BANDAGES/DRESSINGS) ×1 IMPLANT
DEVICE TROCAR PUNCTURE CLOSURE (ENDOMECHANICALS) ×1 IMPLANT
DRAIN PENROSE 0.5X18 (DRAIN) IMPLANT
DRAPE ARM DVNC X/XI (DISPOSABLE) ×4 IMPLANT
DRAPE CARDIOVASC SPLIT 88X140 (DRAPES) ×1 IMPLANT
DRAPE COLUMN DVNC XI (DISPOSABLE) ×1 IMPLANT
DRAPE SURG ORHT 6 SPLT 77X108 (DRAPES) ×1 IMPLANT
DRIVER NDL MEGA SUTCUT DVNCXI (INSTRUMENTS) ×1 IMPLANT
DRIVER NDLE MEGA SUTCUT DVNCXI (INSTRUMENTS) ×1 IMPLANT
ELECTRODE REM PT RTRN 9FT ADLT (ELECTROSURGICAL) ×1 IMPLANT
FORCEPS BPLR FENES DVNC XI (FORCEP) ×1 IMPLANT
GLOVE BIO SURGEON STRL SZ7.5 (GLOVE) ×5 IMPLANT
GOWN STRL REUS W/ TWL LRG LVL3 (GOWN DISPOSABLE) ×1 IMPLANT
GOWN STRL REUS W/ TWL XL LVL3 (GOWN DISPOSABLE) ×2 IMPLANT
GOWN STRL REUS W/TWL 2XL LVL3 (GOWN DISPOSABLE) ×1 IMPLANT
IRRIGATION SUCT STRKRFLW 2 WTP (MISCELLANEOUS) ×1 IMPLANT
KIT BASIN OR (CUSTOM PROCEDURE TRAY) ×1 IMPLANT
KIT TURNOVER KIT B (KITS) IMPLANT
MARKER SKIN DUAL TIP RULER LAB (MISCELLANEOUS) ×1 IMPLANT
NDL 22X1.5 STRL (OR ONLY) (MISCELLANEOUS) ×1 IMPLANT
NDL INSUFFLATION 14GA 120MM (NEEDLE) ×1 IMPLANT
NEEDLE 22X1.5 STRL (OR ONLY) (MISCELLANEOUS) ×1 IMPLANT
NEEDLE INSUFFLATION 14GA 120MM (NEEDLE) ×1 IMPLANT
OBTURATOR OPTICALSTD 8 DVNC (TROCAR) IMPLANT
PENCIL SMOKE EVACUATOR (MISCELLANEOUS) IMPLANT
RETRACTOR GRSP SML 8 DVNC XI (INSTRUMENTS) ×1 IMPLANT
SCISSORS LAP 5X35 DISP (ENDOMECHANICALS) IMPLANT
SCISSORS MNPLR CVD DVNC XI (INSTRUMENTS) IMPLANT
SEAL UNIV 5-12 XI (MISCELLANEOUS) ×4 IMPLANT
SEALER VESSEL EXT DVNC XI (MISCELLANEOUS) ×1 IMPLANT
SET TUBE SMOKE EVAC HIGH FLOW (TUBING) ×1 IMPLANT
SPIKE FLUID TRANSFER (MISCELLANEOUS) ×1 IMPLANT
STAPLER VISISTAT 35W (STAPLE) IMPLANT
STOPCOCK 4 WAY LG BORE MALE ST (IV SETS) ×1 IMPLANT
SUT ETHIBOND 0 36 GRN (SUTURE) ×2 IMPLANT
SUT ETHIBOND 2 0 SH 36X2 (SUTURE) IMPLANT
SUT MNCRL AB 4-0 PS2 18 (SUTURE) ×1 IMPLANT
SUT SILK 0 SH 30 (SUTURE) ×1 IMPLANT
SUT VICRYL 0 UR6 27IN ABS (SUTURE) ×1 IMPLANT
SYR 30ML SLIP (SYRINGE) ×1 IMPLANT
TRAY FOLEY MTR SLVR 16FR STAT (SET/KITS/TRAYS/PACK) ×1 IMPLANT
TRAY LAPAROSCOPIC MC (CUSTOM PROCEDURE TRAY) ×1 IMPLANT
TROCAR ADV FIXATION 5X100MM (TROCAR) ×1 IMPLANT

## 2024-01-22 NOTE — Anesthesia Procedure Notes (Signed)
 Procedure Name: Intubation Date/Time: 01/22/2024 7:43 AM  Performed by: Trenton Frock, CRNAPre-anesthesia Checklist: Patient identified, Emergency Drugs available, Suction available and Patient being monitored Patient Re-evaluated:Patient Re-evaluated prior to induction Oxygen Delivery Method: Circle System Utilized Preoxygenation: Pre-oxygenation with 100% oxygen Induction Type: IV induction Ventilation: Mask ventilation without difficulty Laryngoscope Size: Mac and 3 Tube type: Oral Tube size: 7.0 mm Number of attempts: 1 Airway Equipment and Method: Stylet and Oral airway Placement Confirmation: ETT inserted through vocal cords under direct vision, positive ETCO2 and breath sounds checked- equal and bilateral Secured at: 19 cm Tube secured with: Tape Dental Injury: Teeth and Oropharynx as per pre-operative assessment

## 2024-01-22 NOTE — Interval H&P Note (Signed)
 History and Physical Interval Note:  01/22/2024 7:17 AM  Amy Greer  has presented today for surgery, with the diagnosis of HIATAL HERNIA.  The various methods of treatment have been discussed with the patient and family. After consideration of risks, benefits and other options for treatment, the patient has consented to  Procedure(s): REPAIR, HERNIA, HIATAL, ROBOT-ASSISTED (N/A) as a surgical intervention.  The patient's history has been reviewed, patient examined, no change in status, stable for surgery.  I have reviewed the patient's chart and labs.  Questions were answered to the patient's satisfaction.     Kaylor Simenson

## 2024-01-22 NOTE — Op Note (Signed)
 01/22/2024  9:32 AM  PATIENT:  Amy Greer  68 y.o. female  PRE-OPERATIVE DIAGNOSIS:  HIATAL HERNIA  POST-OPERATIVE DIAGNOSIS:  HIATAL HERNIA type 1  PROCEDURE:  Procedure(s): REPAIR, HERNIA, HIATAL, ROBOT-ASSISTED (N/A) with toupet fundoplication  SURGEON:  Surgeons and Role:    Shela Derby, MD - Primary  ASSISTANTS: Hortense Lyons, RNFA   ANESTHESIA:   local and general  EBL:  minimal   BLOOD ADMINISTERED:none  DRAINS: none   LOCAL MEDICATIONS USED:  BUPIVICAINE  and OTHER exaprel  SPECIMEN:  No Specimen  DISPOSITION OF SPECIMEN:  N/A  COUNTS:  YES  TOURNIQUET:  * No tourniquets in log *  DICTATION: .Dragon Dictation   Findings:small type 1 hiatal hernia, toupet fundoplication.  No mesh   The patient was taken back to the operating room and placed in the supine position with bilateral SCDs in place. The patient was prepped and draped in the usual sterile fashion. After appropriate antibiotics were confirmed a timeout was called and all facts were verified.   A Veress needle technique was used to insufflate the abdomen to 15 mm of mercury the paramedian stab incision. Subsequent to this an 8 mm trocar was introduced as was a 8 millimeter camera. At this time the subsequent robotic trochars x3, were then placed adjacent to this trocar approximately 8-10 cm away. Each trocar was inserted under direct visualization, there were total of 4 trochars. A 12mm trocar was placed in the midclavicular line.  A 0vicryl was placed to help with closure at the end of the case.The assistant trocar was then placed in the right lower quadrant under direct visualization. The Nathanson retractor was then visualized inserted into the abdomen and the incision just to the left of the falciform ligament. This was then placed to retract the liver appropriately. At this time the patient was positioned in reverse Trendelenburg.   At this time the robot patient cart was brought to the  bedside and placed in good position and the arms were docked to the trochars appropriately. At this time I proceeded to incised the gastrohepatic ligament.  At this time I proceeded to mobilize the stomach inferiorly and visualize the right crus. The peritoneum over the right crus was incised and right crus was identified. I proceeded to dissect this inferiorly until the left crus was seen joining the right crus. Once the right crus was adequately dissected we turned our to the left crus which was dissected away. This required traction of the stomach to the right side. Once this was visualized we then proceeded to circumferentially dissect the esophagus away from the surrounding tissue. The anterior and posterior vagus was seen along the esophagus at the GE junction.  These were both preserved throughout the entire case.  At this time the phrenoesophageal fat pad was dissected away from the esophagus. There was a only what appeared to be a sliding hiatal hernia seen. I mobilized the esophagus cephalad approximately 4-5 cm, clearing away the surrounding tissue. The anterior hernia sac was dissected away from the stomach and esophagus.  At this time we turned our attention to the greater curvature the stomach and the omentum was mobilized using the robotic vessel sealer. This was taken up to the greater curvature to the hiatus. This mobilized the entire greater curvature to allow mobilization and the wrap. I then proceeded to bring the greater curvature the stomach posterior to the esophagus, and a shoeshine technique was used to evaluate the mobilization of the greater  curvature.   At this time I proceeded to close the hiatus using interrupted 0 Ethibonds x 2, and 1 anterior stitch. This brought together the hiatal closure without undue stricture to the esophagus.   No mesh was placed  At this time the greater curvature was brought around the esophagus and sutured using 0 silk sutures interrupted fashion  approximately 1 cm apart x3 on each side of the esophagus in a Toupet fashion. A left collar stitch was then used to gastropexy the stomach from the wrap to the diaphragm just lateral to the left crus as.  A second collar stitch was placed from the wrap to the right crus.  The wrap lay loose with no strangulation of the esophagus.  At this time the robot was undocked. The liver trocar was removed.  The liver appeared to be soft and there was a small laceration to the liver.  I cauterized this.  It was hemostatic at the end of the case.  At this time insufflation was evacuated. Skin was reapproximated for Monocryl subcuticular fashion. The skin was then dressed with Dermabond. The patient tolerated the procedure well and was taken to the recovery room in stable condition.   PLAN OF CARE: Admit for overnight observation  PATIENT DISPOSITION:  PACU - hemodynamically stable.   Delay start of Pharmacological VTE agent (>24hrs) due to surgical blood loss or risk of bleeding: not applicable

## 2024-01-22 NOTE — Progress Notes (Signed)
 Pt unable to tolerate clear/thin liquids after surgery. Pt started coughing and stating throat felt tight and as if something was smuggling her throat.   Suction set up at bedside. MD notified.  Pt switched to NPO at this time.  Spouse at bedside during this event.

## 2024-01-22 NOTE — Anesthesia Preprocedure Evaluation (Addendum)
 Anesthesia Evaluation  Patient identified by MRN, date of birth, ID band Patient awake    Reviewed: Allergy & Precautions, H&P , NPO status , Patient's Chart, lab work & pertinent test results  Airway Mallampati: II  TM Distance: >3 FB Neck ROM: Full    Dental  (+) Edentulous Upper, Edentulous Lower   Pulmonary sleep apnea , former smoker   Pulmonary exam normal breath sounds clear to auscultation       Cardiovascular + Peripheral Vascular Disease  Normal cardiovascular exam Rhythm:Regular Rate:Normal     Neuro/Psych   Anxiety Depression    negative neurological ROS  negative psych ROS   GI/Hepatic Neg liver ROS, hiatal hernia,GERD  ,,  Endo/Other  negative endocrine ROS    Renal/GU negative Renal ROS  negative genitourinary   Musculoskeletal negative musculoskeletal ROS (+)    Abdominal  (+) + obese  Peds negative pediatric ROS (+)  Hematology negative hematology ROS (+)   Anesthesia Other Findings   Reproductive/Obstetrics negative OB ROS                             Anesthesia Physical Anesthesia Plan  ASA: 2  Anesthesia Plan: General   Post-op Pain Management: Tylenol PO (pre-op)*   Induction: Intravenous, Rapid sequence and Cricoid pressure planned  PONV Risk Score and Plan: 3 and Ondansetron, Dexamethasone, Midazolam and Treatment may vary due to age or medical condition  Airway Management Planned: Oral ETT  Additional Equipment:   Intra-op Plan:   Post-operative Plan: Extubation in OR  Informed Consent: I have reviewed the patients History and Physical, chart, labs and discussed the procedure including the risks, benefits and alternatives for the proposed anesthesia with the patient or authorized representative who has indicated his/her understanding and acceptance.     Dental advisory given  Plan Discussed with: CRNA  Anesthesia Plan Comments:         Anesthesia Quick Evaluation

## 2024-01-22 NOTE — Transfer of Care (Signed)
 Immediate Anesthesia Transfer of Care Note  Patient: Amy Greer  Procedure(s) Performed: REPAIR, HERNIA, HIATAL, ROBOT-ASSISTED  Patient Location: PACU  Anesthesia Type:General  Level of Consciousness: alert , oriented, and patient cooperative  Airway & Oxygen Therapy: Patient Spontanous Breathing and Patient connected to face mask oxygen  Post-op Assessment: Report given to RN, Post -op Vital signs reviewed and stable, Patient moving all extremities X 4, and Patient able to stick tongue midline  Post vital signs: Reviewed and stable  Last Vitals:  Vitals Value Taken Time  BP 143/79 01/22/24 0957  Temp 98.3   Pulse 80 01/22/24 0959  Resp 18 01/22/24 0959  SpO2 94 % 01/22/24 0959  Vitals shown include unfiled device data.  Last Pain:  Vitals:   01/22/24 0559  TempSrc:   PainSc: 0-No pain         Complications: No notable events documented.

## 2024-01-22 NOTE — Anesthesia Postprocedure Evaluation (Signed)
 Anesthesia Post Note  Patient: Amy Greer  Procedure(s) Performed: REPAIR, HERNIA, HIATAL, ROBOT-ASSISTED     Patient location during evaluation: PACU Anesthesia Type: General Level of consciousness: awake and alert Pain management: pain level controlled Vital Signs Assessment: post-procedure vital signs reviewed and stable Respiratory status: spontaneous breathing, nonlabored ventilation and respiratory function stable Cardiovascular status: blood pressure returned to baseline and stable Postop Assessment: no apparent nausea or vomiting Anesthetic complications: no   No notable events documented.  Last Vitals:  Vitals:   01/22/24 1045 01/22/24 1100  BP: 133/80 (!) 149/86  Pulse: 67 72  Resp: 14 16  Temp:    SpO2: 95% 94%    Last Pain:  Vitals:   01/22/24 1059  TempSrc:   PainSc: 5                  Earvin Goldberg

## 2024-01-22 NOTE — Discharge Instructions (Signed)

## 2024-01-23 ENCOUNTER — Encounter (HOSPITAL_COMMUNITY): Payer: Self-pay | Admitting: General Surgery

## 2024-01-23 ENCOUNTER — Observation Stay (HOSPITAL_COMMUNITY)

## 2024-01-23 LAB — BASIC METABOLIC PANEL WITH GFR
Anion gap: 8 (ref 5–15)
BUN: 9 mg/dL (ref 8–23)
CO2: 23 mmol/L (ref 22–32)
Calcium: 8.2 mg/dL — ABNORMAL LOW (ref 8.9–10.3)
Chloride: 106 mmol/L (ref 98–111)
Creatinine, Ser: 0.82 mg/dL (ref 0.44–1.00)
GFR, Estimated: >60 mL/min (ref >60)
Glucose, Bld: 115 mg/dL — ABNORMAL HIGH (ref 70–99)
Potassium: 4.1 mmol/L (ref 3.5–5.1)
Sodium: 137 mmol/L (ref 135–145)

## 2024-01-23 LAB — CBC
HCT: 39.8 % (ref 36.0–46.0)
Hemoglobin: 13.4 g/dL (ref 12.0–15.0)
MCH: 32.1 pg (ref 26.0–34.0)
MCHC: 33.7 g/dL (ref 30.0–36.0)
MCV: 95.2 fL (ref 80.0–100.0)
Platelets: 152 10*3/uL (ref 150–400)
RBC: 4.18 MIL/uL (ref 3.87–5.11)
RDW: 13 % (ref 11.5–15.5)
WBC: 13 10*3/uL — ABNORMAL HIGH (ref 4.0–10.5)
nRBC: 0 % (ref 0.0–0.2)

## 2024-01-23 MED ORDER — ENSURE PLUS HIGH PROTEIN PO LIQD
237.0000 mL | Freq: Two times a day (BID) | ORAL | Status: DC
  Administered 2024-01-23: 237 mL via ORAL

## 2024-01-23 MED ORDER — IOHEXOL 300 MG/ML  SOLN
100.0000 mL | Freq: Once | INTRAMUSCULAR | Status: AC | PRN
  Administered 2024-01-23: 50 mL via ORAL

## 2024-01-23 NOTE — Progress Notes (Signed)
 Mobility Specialist Progress Note:    01/23/24 1512  Mobility  Activity Ambulated with assistance in room;Transferred from chair to bed  Level of Assistance Contact guard assist, steadying assist  Assistive Device Other (Comment) (HHA)  Distance Ambulated (ft) 5 ft  Activity Response Tolerated well  Mobility Referral Yes  Mobility visit 1 Mobility  Mobility Specialist Start Time (ACUTE ONLY) 1500  Mobility Specialist Stop Time (ACUTE ONLY) 1512  Mobility Specialist Time Calculation (min) (ACUTE ONLY) 12 min   Pt received finishing ambulating w/ nursing staff before getting nauseas needing to sit down. MS assisted RN getting pt back to bed. Pt required HHA to get back to bed and minA to return to supine. Call bell and personal belongings in reach. All needs met. RN in room.  Inetta Manes Mobility Specialist  Please contact vis Secure Chat or  Rehab Office [phone]

## 2024-01-23 NOTE — Plan of Care (Signed)
   Problem: Education: Goal: Knowledge of General Education information will improve Description: Including pain rating scale, medication(s)/side effects and non-pharmacologic comfort measures Outcome: Progressing   Problem: Health Behavior/Discharge Planning: Goal: Ability to manage health-related needs will improve Outcome: Progressing   Problem: Clinical Measurements: Goal: Will remain free from infection Outcome: Progressing

## 2024-01-23 NOTE — Progress Notes (Signed)
 1 Day Post-Op   Subjective/Chief Complaint: Patient doing well today.  Patient with soreness to her upper shoulders likely secondary to gas.  Awaiting esophagram today.   Objective: Vital signs in last 24 hours: Temp:  [97.9 F (36.6 C)-98.4 F (36.9 C)] 98.3 F (36.8 C) (05/28 0406) Pulse Rate:  [65-80] 70 (05/28 0406) Resp:  [11-23] 17 (05/28 0049) BP: (123-149)/(66-86) 148/76 (05/28 0406) SpO2:  [92 %-98 %] 98 % (05/28 0406)    Intake/Output from previous day: 05/27 0701 - 05/28 0700 In: 1959.7 [I.V.:1959.7] Out: 975 [Urine:875; Blood:100] Intake/Output this shift: No intake/output data recorded.  General appearance: alert and cooperative GI: soft, non-tender; bowel sounds normal; no masses,  no organomegaly and incisions clean dry and intact.  Lab Results:  Recent Labs    01/23/24 0433  WBC 13.0*  HGB 13.4  HCT 39.8  PLT 152   BMET No results for input(s): "NA", "K", "CL", "CO2", "GLUCOSE", "BUN", "CREATININE", "CALCIUM" in the last 72 hours. PT/INR No results for input(s): "LABPROT", "INR" in the last 72 hours. ABG No results for input(s): "PHART", "HCO3" in the last 72 hours.  Invalid input(s): "PCO2", "PO2"  Studies/Results: No results found.  Anti-infectives: Anti-infectives (From admission, onward)    Start     Dose/Rate Route Frequency Ordered Stop   01/22/24 0600  vancomycin (VANCOCIN) IVPB 1000 mg/200 mL premix        1,000 mg 200 mL/hr over 60 Minutes Intravenous On call to O.R. 01/22/24 0542 01/22/24 1233       Assessment/Plan: s/p Procedure(s): REPAIR, HERNIA, HIATAL, ROBOT-ASSISTED (N/A) -Await esophagram today.  If no signs of leak okay to advance to pured diet. -Mobilize today. -Possible home later today versus tomorrow.  LOS: 0 days    Shela Derby 01/23/2024

## 2024-01-23 NOTE — Progress Notes (Signed)
 RD consulted for nutrition education regarding diet instructions s/p Nissen (pureed diet for 2 weeks post-op).    RD provided "Eating after your esophageal surgery" handout from [facility]. Reviewed patient's dietary recall. Provided examples on ways to modify foods on pureed diet. Encouraged pt to blend foods using a blender or food processor and strain to obtain desired consistency (no lumps). Also encouraged use of commercial pureed foods and oral nutritional supplements such as Ensure to provide adequate calories and protein. Discussed how to modify recipes to add calories and protein.    RD discussed why it is important for patient to adhere to diet recommendations and discussed possibility of diet advancement upon MD follow-up. Teach back method used.   Expect good compliance.   Body mass index is 32.78 kg/m. Pt meets criteria for Obese based on current BMI.   Current diet order is pureed, thin liquids. Labs and medications reviewed. No further nutrition interventions warranted at this time. RD contact information provided. If additional nutrition issues arise, please re-consult RD.

## 2024-01-23 NOTE — Progress Notes (Signed)
 Ambulated patient in hallway with supplemental oxygen at 2L [place].  Patient ambulated approximately 10 feet into hallway before needing to sit and rest.  Patient also experienced episode of pain and emesis.  Patient was returned to bed with supplemental oxygen and given medication for symptom management.

## 2024-01-24 DIAGNOSIS — Z83511 Family history of glaucoma: Secondary | ICD-10-CM | POA: Diagnosis not present

## 2024-01-24 DIAGNOSIS — Z880 Allergy status to penicillin: Secondary | ICD-10-CM | POA: Diagnosis not present

## 2024-01-24 DIAGNOSIS — Z9049 Acquired absence of other specified parts of digestive tract: Secondary | ICD-10-CM | POA: Diagnosis not present

## 2024-01-24 DIAGNOSIS — R1310 Dysphagia, unspecified: Secondary | ICD-10-CM | POA: Diagnosis present

## 2024-01-24 DIAGNOSIS — X58XXXA Exposure to other specified factors, initial encounter: Secondary | ICD-10-CM | POA: Diagnosis present

## 2024-01-24 DIAGNOSIS — G4733 Obstructive sleep apnea (adult) (pediatric): Secondary | ICD-10-CM | POA: Diagnosis present

## 2024-01-24 DIAGNOSIS — Z87891 Personal history of nicotine dependence: Secondary | ICD-10-CM | POA: Diagnosis not present

## 2024-01-24 DIAGNOSIS — K449 Diaphragmatic hernia without obstruction or gangrene: Secondary | ICD-10-CM | POA: Diagnosis present

## 2024-01-24 DIAGNOSIS — K2100 Gastro-esophageal reflux disease with esophagitis, without bleeding: Secondary | ICD-10-CM | POA: Diagnosis present

## 2024-01-24 DIAGNOSIS — S36113A Laceration of liver, unspecified degree, initial encounter: Secondary | ICD-10-CM | POA: Diagnosis present

## 2024-01-24 DIAGNOSIS — I739 Peripheral vascular disease, unspecified: Secondary | ICD-10-CM | POA: Diagnosis present

## 2024-01-24 DIAGNOSIS — E669 Obesity, unspecified: Secondary | ICD-10-CM | POA: Diagnosis present

## 2024-01-24 DIAGNOSIS — R110 Nausea: Secondary | ICD-10-CM | POA: Diagnosis not present

## 2024-01-24 DIAGNOSIS — Z6836 Body mass index (BMI) 36.0-36.9, adult: Secondary | ICD-10-CM | POA: Diagnosis not present

## 2024-01-24 MED ORDER — METOCLOPRAMIDE HCL 5 MG/ML IJ SOLN
10.0000 mg | Freq: Four times a day (QID) | INTRAMUSCULAR | Status: DC
  Administered 2024-01-24 – 2024-01-25 (×4): 10 mg via INTRAVENOUS
  Filled 2024-01-24 (×5): qty 2

## 2024-01-24 MED ORDER — ACETAMINOPHEN 325 MG PO TABS
650.0000 mg | ORAL_TABLET | Freq: Four times a day (QID) | ORAL | Status: DC | PRN
  Administered 2024-01-24 – 2024-01-25 (×2): 650 mg via ORAL
  Filled 2024-01-24 (×2): qty 2

## 2024-01-24 MED ORDER — ENOXAPARIN SODIUM 40 MG/0.4ML IJ SOSY
40.0000 mg | PREFILLED_SYRINGE | Freq: Every day | INTRAMUSCULAR | Status: DC
  Administered 2024-01-24 – 2024-01-25 (×2): 40 mg via SUBCUTANEOUS
  Filled 2024-01-24 (×2): qty 0.4

## 2024-01-24 NOTE — Progress Notes (Signed)
 2 Days Post-Op   Subjective/Chief Complaint: Pt with some nausea with standing She is tol liquids w/o nausea   Objective: Vital signs in last 24 hours: Temp:  [97.9 F (36.6 C)-98.1 F (36.7 C)] 98.1 F (36.7 C) (05/29 0801) Pulse Rate:  [59-65] 62 (05/29 0801) Resp:  [16-18] 17 (05/29 0801) BP: (107-142)/(59-78) 142/78 (05/29 0801) SpO2:  [94 %-97 %] 94 % (05/29 0801) Last BM Date : 01/21/24  Intake/Output from previous day: No intake/output data recorded. Intake/Output this shift: No intake/output data recorded.  General appearance: alert and cooperative GI: soft, non-tender; bowel sounds normal; no masses,  no organomegaly and inc c/d/i  Lab Results:  Recent Labs    01/23/24 0433  WBC 13.0*  HGB 13.4  HCT 39.8  PLT 152   BMET Recent Labs    01/23/24 0814  NA 137  K 4.1  CL 106  CO2 23  GLUCOSE 115*  BUN 9  CREATININE 0.82  CALCIUM 8.2*   PT/INR No results for input(s): "LABPROT", "INR" in the last 72 hours. ABG No results for input(s): "PHART", "HCO3" in the last 72 hours.  Invalid input(s): "PCO2", "PO2"  Studies/Results: DG ESOPHAGUS W SINGLE CM (SOL OR THIN BA) Result Date: 01/23/2024 CLINICAL DATA:  Status post laparoscopic hiatal hernia repair with Toupet fundoplication. Request for esophagram to evaluate for postoperative leak. EXAM: ESOPHAGUS/BARIUM SWALLOW/TABLET STUDY TECHNIQUE: Limited single contrast examination was performed using water-soluble contrast. This exam was performed by Prudence Brown PA-C , and was supervised and interpreted by Dr. Veneta Gilding. FLUOROSCOPY: Radiation Exposure Index (as provided by the fluoroscopic device): 24.6 mGy Kerma COMPARISON:  None Available. FINDINGS: Limited water soluble contrast esophagram demonstrates no definitive evidence of contrast extravasation or leak. Expected narrowing through fundoplication zone without obstruction. IMPRESSION: No evidence of contrast extravasation or leak. Electronically  Signed   By: Nicoletta Barrier M.D.   On: 01/23/2024 16:27    Anti-infectives: Anti-infectives (From admission, onward)    Start     Dose/Rate Route Frequency Ordered Stop   01/22/24 0600  vancomycin  (VANCOCIN ) IVPB 1000 mg/200 mL premix        1,000 mg 200 mL/hr over 60 Minutes Intravenous On call to O.R. 01/22/24 0542 01/22/24 1233       Assessment/Plan: s/p Procedure(s): REPAIR, HERNIA, HIATAL, ROBOT-ASSISTED (N/A) -encourage PO -will add reglan  -Mobilize with PT -home later today or tomorrow  LOS: 0 days    Shela Derby 01/24/2024

## 2024-01-24 NOTE — Plan of Care (Signed)
  Problem: Activity: Goal: Risk for activity intolerance will decrease Outcome: Progressing   Problem: Elimination: Goal: Will not experience complications related to bowel motility Outcome: Progressing   Problem: Pain Managment: Goal: General experience of comfort will improve and/or be controlled Outcome: Progressing   Problem: Safety: Goal: Ability to remain free from injury will improve Outcome: Progressing

## 2024-01-24 NOTE — Plan of Care (Signed)

## 2024-01-24 NOTE — Evaluation (Addendum)
 Physical Therapy Brief Evaluation and Discharge Note Patient Details Name: Amy Greer MRN: [id number] DOB: [birth year] Today's Date: 01/24/2024   History of Present Illness  Pt is a 68 y.o. F who presents 01/22/2024 with large hiatal hernia who presents with dysphagia and reflux symptoms. Now s/p hernia repair with toupet fundoplication. Significant PMH: GERD, class 2 obesity.  Clinical Impression  Patient evaluated by Physical Therapy with no further acute PT needs identified. Pt reports pain as 3/10 and denies nausea after receiving Zofran. Pt received taking a sitting rest break in the hallway with her friend. Pt ambulating an additional 75 ft with no assistive device or physical assist; demonstrates slowed gait speed for age, but overall no gross unsteadiness. Pt negotiated 12 steps with a L railing to simulate home set up. SpO2 92% on RA, HR 70's. All education has been completed and the patient has no further questions. No follow-up Physical Therapy or equipment needs. PT is signing off. Thank you for this referral.      PT Assessment Patient does not need any further PT services  Assistance Needed at Discharge  PRN    Equipment Recommendations None recommended by PT  Recommendations for Other Services       Precautions/Restrictions Precautions Precautions: None Restrictions Weight Bearing Restrictions Per Provider Order: No        Mobility  Bed Mobility       General bed mobility comments: OOB in hallway  Transfers Overall transfer level: Independent Equipment used: None                    Ambulation/Gait Ambulation/Gait assistance: Modified independent (Device/Increase time) Gait Distance (Feet): 150 Feet Assistive device: None Gait Pattern/deviations: Step-through pattern, Decreased stride length      Home Activity Instructions    Stairs Stairs: Yes Stairs assistance: Modified independent (Device/Increase time) Stair Management: One  rail Left Number of Stairs: 12 General stair comments: cues for step by step  Modified Rankin (Stroke Patients Only)        Balance Overall balance assessment: No apparent balance deficits (not formally assessed)                        Pertinent Vitals/Pain PT - Brief Vital Signs All Vital Signs Stable: Yes Pain Assessment Pain Assessment: 0-10 Pain Score: 3  Pain Location: abdomen Pain Descriptors / Indicators: Discomfort Pain Intervention(s): Monitored during session     Home Living Family/patient expects to be discharged to:: Private residence Living Arrangements: Spouse/significant other Available Help at Discharge: Family Home Environment: Level entry   Home Equipment: None        Prior Function Level of Independence: Independent Comments: Works at [employer]    UE/LE Assessment   UE ROM/Strength/Tone/Coordination: [employer]    LE ROM/Strength/Tone/Coordination: [employer]      Communication   Communication Communication: No apparent difficulties     Cognition Overall Cognitive Status: Appears within functional limits for tasks assessed/performed       General Comments      Exercises     Assessment/Plan    PT Problem List         PT Visit Diagnosis Pain;Difficulty in walking, not elsewhere classified (R26.2)    No Skilled PT All education completed;Patient is modified independent with all activity/mobility   Co-evaluation                AMPAC 6 Clicks Help needed turning from your back to your side while  in a flat bed without using bedrails?: None Help needed moving from lying on your back to sitting on the side of a flat bed without using bedrails?: None Help needed moving to and from a bed to a chair (including a wheelchair)?: None Help needed standing up from a chair using your arms (e.g., wheelchair or bedside chair)?: None Help needed to walk in hospital room?: None Help needed climbing 3-5 steps with a railing? : None 6 Click  Score: 24      End of Session   Activity Tolerance: Patient tolerated treatment well Patient left: in chair;with call bell/phone within reach   PT Visit Diagnosis: Pain;Difficulty in walking, not elsewhere classified (R26.2) Pain - part of body:  (abdomen)     Time: 9518-8416 PT Time Calculation (min) (ACUTE ONLY): 24 min  Charges:   PT Evaluation $PT Eval Low Complexity: 1 Low      Verdia Glad, PT, DPT Acute Rehabilitation Services Office [phone]   Claria Crofts  01/24/2024, 12:14 PM

## 2024-01-25 MED ORDER — POLYETHYLENE GLYCOL 3350 17 G PO PACK
17.0000 g | PACK | Freq: Every day | ORAL | Status: DC
  Administered 2024-01-25: 17 g via ORAL

## 2024-01-25 MED ORDER — TRAMADOL HCL 50 MG PO TABS: 50.0000 mg | ORAL_TABLET | Freq: Four times a day (QID) | ORAL | 0 refills | Status: DC | PRN

## 2024-01-25 MED ORDER — METOCLOPRAMIDE HCL 10 MG PO TABS: 10.0000 mg | ORAL_TABLET | Freq: Three times a day (TID) | ORAL | 1 refills | Status: AC

## 2024-01-25 MED ORDER — ONDANSETRON 4 MG PO TBDP: 4.0000 mg | ORAL_TABLET | Freq: Four times a day (QID) | ORAL | 0 refills | Status: AC | PRN

## 2024-01-25 NOTE — Progress Notes (Signed)
 3 Days Post-Op   Subjective/Chief Complaint: Pt amb well with PT Some nausea but reglan helping   Objective: Vital signs in last 24 hours: Temp:  [97.9 F (36.6 C)-98.3 F (36.8 C)] 98.3 F (36.8 C) (05/30 0458) Pulse Rate:  [62-65] 62 (05/30 0458) Resp:  [17-18] 18 (05/30 0458) BP: (105-142)/(53-78) 117/62 (05/30 0458) SpO2:  [93 %-95 %] 95 % (05/30 0458) Last BM Date : 01/21/24  Intake/Output from previous day: 05/29 0701 - 05/30 0700 In: 840 [P.O.:840] Out: 25 [Emesis/NG output:25] Intake/Output this shift: No intake/output data recorded.  General appearance: alert and cooperative GI: soft, non-tender; bowel sounds normal; no masses,  no organomegaly and inc cdi  Lab Results:  Recent Labs    01/23/24 0433  WBC 13.0*  HGB 13.4  HCT 39.8  PLT 152   BMET Recent Labs    01/23/24 0814  NA 137  K 4.1  CL 106  CO2 23  GLUCOSE 115*  BUN 9  CREATININE 0.82  CALCIUM 8.2*   PT/INR No results for input(s): "LABPROT", "INR" in the last 72 hours. ABG No results for input(s): "PHART", "HCO3" in the last 72 hours.  Invalid input(s): "PCO2", "PO2"  Studies/Results: DG ESOPHAGUS W SINGLE CM (SOL OR THIN BA) Result Date: 01/23/2024 CLINICAL DATA:  Status post laparoscopic hiatal hernia repair with Toupet fundoplication. Request for esophagram to evaluate for postoperative leak. EXAM: ESOPHAGUS/BARIUM SWALLOW/TABLET STUDY TECHNIQUE: Limited single contrast examination was performed using water-soluble contrast. This exam was performed by Prudence Brown PA-C , and was supervised and interpreted by Dr. Veneta Gilding. FLUOROSCOPY: Radiation Exposure Index (as provided by the fluoroscopic device): 24.6 mGy Kerma COMPARISON:  None Available. FINDINGS: Limited water soluble contrast esophagram demonstrates no definitive evidence of contrast extravasation or leak. Expected narrowing through fundoplication zone without obstruction. IMPRESSION: No evidence of contrast extravasation  or leak. Electronically Signed   By: Nicoletta Barrier M.D.   On: 01/23/2024 16:27    Anti-infectives: Anti-infectives (From admission, onward)    Start     Dose/Rate Route Frequency Ordered Stop   01/22/24 0600  vancomycin (VANCOCIN) IVPB 1000 mg/200 mL premix        1,000 mg 200 mL/hr over 60 Minutes Intravenous On call to O.R. 01/22/24 0542 01/22/24 1233       Assessment/Plan: s/p Procedure(s): REPAIR, HERNIA, HIATAL, ROBOT-ASSISTED (N/A) -plan DC later today -mobiize -stool softener  LOS: 1 day    Stephani Janak 01/25/2024

## 2024-01-25 NOTE — Progress Notes (Signed)
 Patient met discharge criteria. Patient feels okay to go home and will follow up with MD in two weeks. AVS went over with patient with no further questions. IV removed. Patient taken by staff via wheelchair out of hospital.

## 2024-01-25 NOTE — Discharge Summary (Signed)
 Physician Discharge Summary  Patient ID: Amy Greer MRN: [id number] DOB/AGE: 1955/09/20 68 y.o.  Admit date: 01/22/2024 Discharge date: 01/25/2024  Admission Diagnoses: hiatal hernia with reflux  Discharge Diagnoses:  Principal Problem:   S/P Nissen fundoplication (with gastrostomy tube placement) [facility])   Discharged Condition: good  Hospital Course: PT underwent robo HHR with toupet fundoplication.  PT did well post op and had min pain.  She had some nausea.  She was started on reglan which helped.  She was amb well on her own.  DG esoph showed no leak POD1.  She tol pureed diet well  Consults: None  Significant Diagnostic Studies: DG esoph with no leak  Treatments: surgery: as above  Discharge Exam: Blood pressure 117/62, pulse 62, temperature 98.3 F (36.8 C), temperature source Oral, resp. rate 18, height 5\' 4"  (1.626 m), weight 95.9 kg, SpO2 95%. General appearance: alert and cooperative GI: soft, non-tender; bowel sounds normal; no masses,  no organomegaly  Disposition: Discharge disposition: 01-Home or Self Care       Discharge Instructions     Increase activity slowly   Complete by: As directed       Allergies as of 01/25/2024       Reactions   Penicillins Swelling   SWELLING OF THE THROAT         Medication List     TAKE these medications    carboxymethylcellulose 0.5 % Soln Commonly known as: REFRESH PLUS Place 1 drop into both eyes 3 (three) times daily as needed (dry eyes).   famotidine  40 MG tablet Commonly known as: PEPCID  Take 1 tablet (40 mg total) by mouth at bedtime.   metoCLOPramide 10 MG tablet Commonly known as: REGLAN Take 1 tablet (10 mg total) by mouth 3 (three) times daily with meals.   OMEGA 3 PO Take 1 capsule by mouth daily.   ondansetron 4 MG disintegrating tablet Commonly known as: ZOFRAN-ODT Take 1 tablet (4 mg total) by mouth every 6 (six) hours as needed for nausea.   sertraline  50 MG tablet Commonly  known as: Zoloft  Take 1 tablet (50 mg total) by mouth at bedtime.   traMADol 50 MG tablet Commonly known as: Ultram Take 1 tablet (50 mg total) by mouth every 6 (six) hours as needed.   Vitamin D3 25 MCG (1000 UT) Caps Take 3,000 Units by mouth daily.        Follow-up Information     Shela Derby, MD Follow up in 2 week(s).   Specialty: General Surgery Why: Post op visit Contact information: [address] [place] [address] [place] [address] [phone]                 Signed: Shela Derby 01/25/2024, 8:02 AM

## 2024-01-29 ENCOUNTER — Ambulatory Visit (INDEPENDENT_AMBULATORY_CARE_PROVIDER_SITE_OTHER): Payer: PRIVATE HEALTH INSURANCE | Admitting: Internal Medicine

## 2024-01-29 ENCOUNTER — Encounter: Payer: Self-pay | Admitting: Internal Medicine

## 2024-01-29 VITALS — BP 110/74 | HR 62 | Temp 98.2°F | Ht 64.0 in | Wt 205.6 lb

## 2024-01-29 DIAGNOSIS — I872 Venous insufficiency (chronic) (peripheral): Secondary | ICD-10-CM

## 2024-01-29 DIAGNOSIS — N6342 Unspecified lump in left breast, subareolar: Secondary | ICD-10-CM

## 2024-01-29 DIAGNOSIS — H6121 Impacted cerumen, right ear: Secondary | ICD-10-CM | POA: Diagnosis not present

## 2024-01-29 DIAGNOSIS — F411 Generalized anxiety disorder: Secondary | ICD-10-CM | POA: Diagnosis not present

## 2024-01-29 DIAGNOSIS — Z0000 Encounter for general adult medical examination without abnormal findings: Secondary | ICD-10-CM | POA: Diagnosis not present

## 2024-01-29 DIAGNOSIS — Z6835 Body mass index (BMI) 35.0-35.9, adult: Secondary | ICD-10-CM

## 2024-01-29 DIAGNOSIS — E6609 Other obesity due to excess calories: Secondary | ICD-10-CM

## 2024-01-29 DIAGNOSIS — E559 Vitamin D deficiency, unspecified: Secondary | ICD-10-CM

## 2024-01-29 DIAGNOSIS — K449 Diaphragmatic hernia without obstruction or gangrene: Secondary | ICD-10-CM

## 2024-01-29 DIAGNOSIS — E66812 Obesity, class 2: Secondary | ICD-10-CM

## 2024-01-29 NOTE — Progress Notes (Signed)
 I,Victoria T Basil Lim, CMA,acting as a Neurosurgeon for Smiley Dung, MD.,have documented all relevant documentation on the behalf of Smiley Dung, MD,as directed by  Smiley Dung, MD while in the presence of Smiley Dung, MD.  Subjective:    Patient ID: Amy Greer , female    DOB: August 13, 1956 , 68 y.o.   MRN: [id number]  Chief Complaint  Patient presents with   Annual Exam    Patient presents today for annual exam. She reports compliance with medications. Denies headache, chest pain & sob. She would like  NRM checked.  GYN: she is not currently established with one.     HPI Discussed the use of AI scribe software for clinical note transcription with the patient, who gave verbal consent to proceed.  History of Present Illness Amy Greer is a 68 year old female who presents for physical exam, follow-up after recent surgery and to discuss cholesterol testing.  She recently underwent surgery on May 27th for a hiatal hernia. Prior to the surgery, she experienced severe reflux. Post-surgery, she manages her symptoms with metoclopramide  10 mg three times a day and tramadol  for pain. She avoids certain foods to manage nausea and uses medication for nausea as needed. No new symptoms of reflux post-surgery.  She has a history of venous insufficiency in both legs, initially starting in one leg and now affecting both. Her ankles were swollen post-surgery due to fluid retention but have since improved. She wears compression socks and drinks more water, although not consistently.  She has a family history of cholesterol problems, as her mother had cholesterol issues. She is interested in cholesterol testing, specifically an NMR test. Her last cholesterol level was 78, with a cardiovascular risk of 4.7% based on last year's numbers.  She experiences work-related stress, working at the [employer], which involves dealing with a high volume of non-English speaking clients and challenging work  conditions. She is currently on sertraline  50 mg daily for anxiety and stress management, which she feels is adequate.  She reports a history of slow bowel movements and is using Miralax  to manage this. She has had one bowel movement since her recent hospital discharge.  She is post-menopausal and experiences skin dryness, for which she has ordered probiotics to help with her pH balance. She is sexually active and does not report pain during intercourse but notes general skin dryness.  She performs regular breast self-exams and has a history of cysts in her breasts. She had a mammogram in December, which focused on the right breast, but she is concerned about a palpable mass in the left breast.  Past Medical History:  Diagnosis Date   Anxiety    Depression    Dysrhythmia    occasional paplitations   GERD (gastroesophageal reflux disease)    r/t hiatal hernia   History of hiatal hernia    Lower extremity edema    Peripheral vascular disease (HCC)    Chronic Venous Insufficiency   Pneumonia    Sleep apnea      Family History  Problem Relation Age of Onset   Kidney disease Mother    Hyperlipidemia Mother    Lung cancer Sister    Lung cancer Brother    Ulcers Father      Current Outpatient Medications:    carboxymethylcellulose (REFRESH PLUS) 0.5 % SOLN, Place 1 drop into both eyes 3 (three) times daily as needed (dry eyes)., Disp: , Rfl:    Cholecalciferol (VITAMIN D3) 1000  units CAPS, Take 3,000 Units by mouth daily., Disp: , Rfl:    famotidine  (PEPCID ) 40 MG tablet, Take 1 tablet (40 mg total) by mouth at bedtime., Disp: 90 tablet, Rfl: 2   metoCLOPramide  (REGLAN ) 10 MG tablet, Take 1 tablet (10 mg total) by mouth 3 (three) times daily with meals., Disp: 90 tablet, Rfl: 1   Omega-3 Fatty Acids (OMEGA 3 PO), Take 1 capsule by mouth daily., Disp: , Rfl:    ondansetron  (ZOFRAN -ODT) 4 MG disintegrating tablet, Take 1 tablet (4 mg total) by mouth every 6 (six) hours as needed for  nausea., Disp: 20 tablet, Rfl: 0   sertraline  (ZOLOFT ) 50 MG tablet, Take 1 tablet (50 mg total) by mouth at bedtime., Disp: 30 tablet, Rfl: 2   traMADol  (ULTRAM ) 50 MG tablet, Take 1 tablet (50 mg total) by mouth every 6 (six) hours as needed., Disp: 20 tablet, Rfl: 0   Allergies  Allergen Reactions   Penicillins Swelling    SWELLING OF THE THROAT       The patient states she uses post menopausal status for birth control. No LMP recorded. Patient is postmenopausal.. Negative for Dysmenorrhea. Negative for: breast discharge, breast lump(s), breast pain and breast self exam. Associated symptoms include abnormal vaginal bleeding. Pertinent negatives include abnormal bleeding (hematology), anxiety, decreased libido, depression, difficulty falling sleep, dyspareunia, history of infertility, nocturia, sexual dysfunction, sleep disturbances, urinary incontinence, urinary urgency, vaginal discharge and vaginal itching. Diet regular.The patient states her exercise level is    . The patient's tobacco use is:  Social History   Tobacco Use  Smoking Status Former   Types: Cigarettes  Smokeless Tobacco Never  Tobacco Comments   Pt quit smoking 2014  . She has been exposed to passive smoke. The patient's alcohol use is:  Social History   Substance and Sexual Activity  Alcohol Use Yes   Comment: SOCIAL    Review of Systems  Constitutional: Negative.   HENT: Negative.    Eyes: Negative.   Respiratory: Negative.    Cardiovascular: Negative.   Gastrointestinal: Negative.   Endocrine: Negative.   Genitourinary: Negative.   Musculoskeletal: Negative.   Skin: Negative.   Allergic/Immunologic: Negative.   Neurological: Negative.   Hematological: Negative.   Psychiatric/Behavioral: Negative.       Today's Vitals   01/29/24 1125  BP: 110/74  Pulse: 62  Temp: 98.2 F (36.8 C)  SpO2: 98%  Weight: 205 lb 9.6 oz (93.3 kg)  Height: 5' 4 (1.626 m)   Body mass index is 35.29 kg/m.  Wt  Readings from Last 3 Encounters:  01/29/24 205 lb 9.6 oz (93.3 kg)  01/22/24 211 lb 6.4 oz (95.9 kg)  01/17/24 211 lb 6.4 oz (95.9 kg)    The 10-year ASCVD risk score (Arnett DK, et al., 2019) is: 4.7%   Values used to calculate the score:     Age: 32 years     Clincally relevant sex: Female     Is Non-Hispanic African American: Yes     Diabetic: No     Tobacco smoker: No     Systolic Blood Pressure: 110 mmHg     Is BP treated: No     HDL Cholesterol: 59 mg/dL     Total Cholesterol: 150 mg/dL  Objective:  Physical Exam Vitals and nursing note reviewed.  Constitutional:      Appearance: Normal appearance. She is obese.  HENT:     Head: Normocephalic and atraumatic.     Right Ear:  Ear canal and external ear normal. There is impacted cerumen.     Left Ear: Tympanic membrane, ear canal and external ear normal. There is no impacted cerumen.     Mouth/Throat:     Pharynx: No oropharyngeal exudate or posterior oropharyngeal erythema.   Eyes:     Extraocular Movements: Extraocular movements intact.    Cardiovascular:     Rate and Rhythm: Normal rate and regular rhythm.     Heart sounds: Normal heart sounds.  Pulmonary:     Effort: Pulmonary effort is normal.     Breath sounds: Normal breath sounds.  Chest:  Breasts:    Tanner Score is 5.     Right: Normal.     Left: Mass present.  Abdominal:     General: Bowel sounds are normal.     Palpations: Abdomen is soft.     Comments: Healed surgical scars Abdominal distension  Genitourinary:    Comments: Deferred   Musculoskeletal:     Cervical back: Normal range of motion.     Right lower leg: Edema present.     Left lower leg: Edema present.   Skin:    General: Skin is warm.     Comments: Brawny skin LE   Neurological:     General: No focal deficit present.     Mental Status: She is alert.   Psychiatric:        Mood and Affect: Mood normal.        Behavior: Behavior normal.       Assessment And Plan:      Encounter for general adult medical examination w/o abnormal findings Assessment & Plan: A full exam was performed.  Importance of monthly self breast exams was discussed with the patient.  She is advised to get 30-45 minutes of regular exercise, no less than four to five days per week. Both weight-bearing and aerobic exercises are recommended.  She is advised to follow a healthy diet with at least six fruits/veggies per day, decrease intake of red meat and other saturated fats and to increase fish intake to twice weekly.  Meats/fish should not be fried -- baked, boiled or broiled is preferable. It is also important to cut back on your sugar intake.  Be sure to read labels - try to avoid anything with added sugar, high fructose corn syrup or other sweeteners.  If you must use a sweetener, you can try stevia or monkfruit.  It is also important to avoid artificially sweetened foods/beverages and diet drinks. Lastly, wear SPF 50 sunscreen on exposed skin and when in direct sunlight for an extended period of time.  Be sure to avoid fast food restaurants and aim for at least 60 ounces of water daily.   Due for Pap smear and mammogram. Discussed importance of screenings and addressed post-menopausal symptoms. - Schedule Pap smear. - Encourage regular self-breast exams. - Recommend feminine lotion for skin dryness. - Advise staying hydrated.  Orders: -     Lipoprotein A (LPA) -     NMR LipoProfile+Lipids+IR -     Hepatic function panel -     TSH  GAD (generalized anxiety disorder) Assessment & Plan: Chronic, sx improved on sertraline . Chronic anxiety exacerbated by work stress. Managed with sertraline . Discussed FMLA for anxiety episodes. - Continue sertraline  50 mg daily. - Consider FMLA for anxiety-related episodes.   Right ear impacted cerumen Assessment & Plan: AFTER OBTAINING VERBAL CONSENT, RIGHT EAR WAS FLUSHED BY IRRIGATION. SHE TOLERATED PROCEDURE WELL WITHOUT ANY  COMPLICATIONS. NO TM  ABNORMALITIES WERE NOTED.   Orders: -     Ear Lavage  Subareolar mass of left breast Assessment & Plan:  Palpable mass in left breast requires further evaluation to confirm diagnosis. - Order diagnostic mammogram and ultrasound for left breast.  Orders: -     MM 3D DIAGNOSTIC MAMMOGRAM BILATERAL BREAST; Future -     US  LIMITED ULTRASOUND INCLUDING AXILLA LEFT BREAST ; Future  Chronic venous insufficiency of lower extremity Assessment & Plan: Chronic venous insufficiency with swelling post-surgery. Managed with lifestyle modifications. - Encourage regular use of compression stockings. - Advise increased water intake. - Recommend regular physical activity.   Hiatal hernia Assessment & Plan: She had Xi robotic assisted hiatal hernia repair performed on May 27th.  This was done due to severe reflux sx. Medications prescribed for esophageal function and pain management. - Continue metoclopramide  10 mg three times a day. - Continue tramadol  for pain. - Monitor dietary intake to avoid nausea-triggering foods.   Vitamin D  deficiency disease Assessment & Plan: I WILL CHECK A VIT D LEVEL AND SUPPLEMENT AS NEEDED.  ALSO ENCOURAGED TO SPEND 15 MINUTES IN THE SUN DAILY.   Orders: -     VITAMIN D  25 Hydroxy (Vit-D Deficiency, Fractures)  Class 2 obesity due to excess calories without serious comorbidity with body mass index (BMI) of 35.0 to 35.9 in adult Assessment & Plan: She is encouraged to strive for BMI less than 30 to decrease cardiac risk. Advised to aim for at least 150 minutes of exercise per week.    Cardiovascular Risk Assessment Concern about cholesterol and genetic predisposition to heart disease. Discussed lipoprotein A testing and cardiac calcium scoring. - Order lipoprotein A test. - Discuss cardiac calcium scoring.  Return in 3 months (on 04/30/2024), or pap smear, f/u sertraline , for 1 year HM. Patient was given opportunity to ask questions. Patient verbalized  understanding of the plan and was able to repeat key elements of the plan. All questions were answered to their satisfaction.   I, Smiley Dung, MD, have reviewed all documentation for this visit. The documentation on 01/29/24 for the exam, diagnosis, procedures, and orders are all accurate and complete.

## 2024-01-29 NOTE — Assessment & Plan Note (Addendum)
 A full exam was performed.  Importance of monthly self breast exams was discussed with the patient.  She is advised to get 30-45 minutes of regular exercise, no less than four to five days per week. Both weight-bearing and aerobic exercises are recommended.  She is advised to follow a healthy diet with at least six fruits/veggies per day, decrease intake of red meat and other saturated fats and to increase fish intake to twice weekly.  Meats/fish should not be fried -- baked, boiled or broiled is preferable. It is also important to cut back on your sugar intake.  Be sure to read labels - try to avoid anything with added sugar, high fructose corn syrup or other sweeteners.  If you must use a sweetener, you can try stevia or monkfruit.  It is also important to avoid artificially sweetened foods/beverages and diet drinks. Lastly, wear SPF 50 sunscreen on exposed skin and when in direct sunlight for an extended period of time.  Be sure to avoid fast food restaurants and aim for at least 60 ounces of water daily.   Due for Pap smear and mammogram. Discussed importance of screenings and addressed post-menopausal symptoms. - Schedule Pap smear. - Encourage regular self-breast exams. - Recommend feminine lotion for skin dryness. - Advise staying hydrated.

## 2024-01-29 NOTE — Patient Instructions (Signed)

## 2024-01-30 LAB — NMR LIPOPROFILE+LIPIDS+IR
Cholesterol, Total: 150 mg/dL (ref 100–199)
HDL Particle Number: 28.5 umol/L — ABNORMAL LOW (ref >=30.5)
HDL Size: 9.3 nm (ref >=9.2)
HDL-C: 45 mg/dL (ref >39)
LDL Particle Number: 1092 nmol/L — ABNORMAL HIGH (ref <1000)
LDL Size: 21.3 nm (ref >20.5)
LDL Size: 21.3 nm (ref >=20.8)
LDL-C (NIH Calc): 83 mg/dL (ref 0–99)
LP-IR Score: 46 — ABNORMAL HIGH (ref <=45)
Large HDL-P: 5.8 umol/L (ref >=4.8)
Large VLDL-P: 3.8 nmol/L — ABNORMAL HIGH (ref <=2.7)
Small LDL Particle Number: 532 nmol/L — ABNORMAL HIGH (ref <=527)
Small LDL-P: 532 nmol/L — ABNORMAL HIGH (ref <=527)
Triglycerides: 126 mg/dL (ref 0–149)
VLDL Size: 47.7 nm — ABNORMAL HIGH (ref <=46.6)

## 2024-01-30 LAB — HEPATIC FUNCTION PANEL
ALT: 39 IU/L — ABNORMAL HIGH (ref 0–32)
AST: 17 IU/L (ref 0–40)
Albumin: 4.1 g/dL (ref 3.9–4.9)
Alkaline Phosphatase: 106 IU/L (ref 44–121)
Bilirubin Total: 0.7 mg/dL (ref 0.0–1.2)
Bilirubin, Direct: 0.27 mg/dL (ref 0.00–0.40)
Total Protein: 6.8 g/dL (ref 6.0–8.5)

## 2024-01-30 LAB — TSH: TSH: 3.57 u[IU]/mL (ref 0.450–4.500)

## 2024-01-30 LAB — LIPOPROTEIN A (LPA): Lipoprotein (a): 181.2 nmol/L — ABNORMAL HIGH (ref <75.0)

## 2024-01-30 LAB — VITAMIN D 25 HYDROXY (VIT D DEFICIENCY, FRACTURES): Vit D, 25-Hydroxy: 44.9 ng/mL (ref 30.0–100.0)

## 2024-02-07 LAB — HM MAMMOGRAPHY

## 2024-02-10 ENCOUNTER — Encounter: Payer: Self-pay | Admitting: Internal Medicine

## 2024-02-10 DIAGNOSIS — H6121 Impacted cerumen, right ear: Secondary | ICD-10-CM | POA: Insufficient documentation

## 2024-02-10 NOTE — Assessment & Plan Note (Signed)
AFTER OBTAINING VERBAL CONSENT, RIGHT EAR WAS FLUSHED BY IRRIGATION. SHE TOLERATED PROCEDURE WELL WITHOUT ANY COMPLICATIONS. NO TM ABNORMALITIES WERE NOTED.

## 2024-02-10 NOTE — Assessment & Plan Note (Signed)
 Palpable mass in left breast requires further evaluation to confirm diagnosis. - Order diagnostic mammogram and ultrasound for left breast.

## 2024-02-10 NOTE — Assessment & Plan Note (Signed)
 She is encouraged to strive for BMI less than 30 to decrease cardiac risk. Advised to aim for at least 150 minutes of exercise per week.

## 2024-02-10 NOTE — Assessment & Plan Note (Signed)
 She had Xi robotic assisted hiatal hernia repair performed on May 27th.  This was done due to severe reflux sx. Medications prescribed for esophageal function and pain management. - Continue metoclopramide  10 mg three times a day. - Continue tramadol  for pain. - Monitor dietary intake to avoid nausea-triggering foods.

## 2024-02-10 NOTE — Assessment & Plan Note (Addendum)
 Chronic, sx improved on sertraline . Chronic anxiety exacerbated by work stress. Managed with sertraline . Discussed FMLA for anxiety episodes. - Continue sertraline  50 mg daily. - Consider FMLA for anxiety-related episodes.

## 2024-02-10 NOTE — Assessment & Plan Note (Signed)
 I WILL CHECK A VIT D LEVEL AND SUPPLEMENT AS NEEDED.  ALSO ENCOURAGED TO SPEND 15 MINUTES IN THE SUN DAILY.

## 2024-02-10 NOTE — Assessment & Plan Note (Signed)
 Chronic venous insufficiency with swelling post-surgery. Managed with lifestyle modifications. - Encourage regular use of compression stockings. - Advise increased water intake. - Recommend regular physical activity.

## 2024-02-12 ENCOUNTER — Encounter: Payer: Self-pay | Admitting: Internal Medicine

## 2024-02-25 NOTE — Progress Notes (Signed)
 PROVIDER:  LYNDA LEOS, MD  MRN: [id number] DOB: 1956-05-13 DATE OF ENCOUNTER: 02/25/2024 Subjective     Chief Complaint: Post Operative Visit     History of Present Illness: Amy Greer is a 68 y.o. female who is seen today for HHR and fundoplication  History of Present Illness Amy Greer is a 68 year old female who presents for post-operative follow-up after surgery on May 27.  She experiences persistent fatigue and low energy levels, affecting her ability to work and prompting her to apply for short-term disability. She is actively working on regaining her strength.  She is tol soft diet  She takes Miralax  regularly to aid with bowel movements due to difficulty with pushing.  She notes changes in taste and appetite, with altered taste of water and a need to time her intake for adequate hydration. She consumes chicken and fish, avoiding beef and pork, and cautiously eats bread due to potential discomfort.  She is increasing physical activity by walking indoors at a local [employer] and doing exercises at home to improve stamina. .     Review of Systems: A complete review of systems was obtained from the patient.  I have reviewed this information and discussed as appropriate with the patient.  See HPI as well for other ROS.  ROS    Medical History: Past Medical History:  Diagnosis Date  . Acid reflux   . Glaucoma (increased eye pressure)   . History of cataract   . Vision abnormalities     Patient Active Problem List  Diagnosis  . Obstructive sleep apnea  . Macular hole, left eye  . PCO (posterior capsular opacification), left  . Glaucoma suspect of both eyes    Past Surgical History:  Procedure Laterality Date  . CHOLECYSTECTOMY  2019  . EXTRACTION CATARACT INTRACAPSULAR W/INSERTION INTRAOCULAR PROSTHESIS Left 09/13/2021   [facility]/ Dr.Shah  . hiatal hernia repair       Allergies  Allergen Reactions  . Penicillins Swelling    SWELLING  OF THE THROAT    Current Outpatient Medications on File Prior to Visit  Medication Sig Dispense Refill  . omeprazole (PRILOSEC) 40 MG DR capsule Take by mouth    . pantoprazole (PROTONIX) 40 MG DR tablet 1 tab(s) orally 20 minutes before breakfast for 90 days     No current facility-administered medications on file prior to visit.    Family History  Problem Relation Age of Onset  . Cataracts Mother   . Glaucoma Mother      Social History   Tobacco Use  Smoking Status Former  . Types: Cigarettes  Smokeless Tobacco Never     Social History   Socioeconomic History  . Marital status: Married  Tobacco Use  . Smoking status: Former    Types: Cigarettes  . Smokeless tobacco: Never  Vaping Use  . Vaping status: Never Used  Substance and Sexual Activity  . Alcohol use: Yes  . Drug use: Never   Social Drivers of Health   Food Insecurity: Patient Unable To Answer (01/22/2024)   Received from [facility]   Hunger Vital Sign   . Within the past 12 months, you worried that your food would run out before you got the money to buy more.: Patient unable to answer   . Within the past 12 months, the food you bought just didn't last and you didn't have money to get more.: Patient unable to answer   Received from [facility]   Social Network  Housing Stability: Unknown (11/29/2023)   Housing Stability Vital Sign   . Homeless in the Last Year: No    Objective:    Vitals:   02/25/24 0826  PainSc: 0-No pain    There is no height or weight on file to calculate BMI.  Physical Exam   Abd: inc c/d/i    Assessment and Plan:     Diagnoses and all orders for this visit:  H/O hiatal hernia     Assessment & Plan Post-surgical fatigue   Persistent fatigue one month after surgery is typical. Concerns about work stamina were discussed. Continue the exercise regimen, including indoor walking at the [facility]. Encourage hydration with at least three bottles of water daily. Maintain a  soft diet for two weeks, then reassess for a normal diet. Discuss short-term disability paperwork for work accommodations. Schedule a follow-up in three weeks, possibly with a PA.  Will assess her stamina is OK to be back at work on that visit vs. Another 1-2 weeks off.  Will be able to have a normal diet at that point as long as she's had no dysphagia.  If all OK will not have to come back after that visit.   Return in about 2 weeks (around 03/10/2024).   LYNDA LEOS, MD

## 2024-03-06 ENCOUNTER — Other Ambulatory Visit: Payer: Self-pay | Admitting: Family Medicine

## 2024-03-06 DIAGNOSIS — F411 Generalized anxiety disorder: Secondary | ICD-10-CM

## 2024-05-01 ENCOUNTER — Ambulatory Visit (INDEPENDENT_AMBULATORY_CARE_PROVIDER_SITE_OTHER): Payer: PRIVATE HEALTH INSURANCE | Admitting: Internal Medicine

## 2024-05-01 ENCOUNTER — Encounter: Payer: Self-pay | Admitting: Internal Medicine

## 2024-05-01 ENCOUNTER — Ambulatory Visit: Payer: PRIVATE HEALTH INSURANCE | Admitting: Internal Medicine

## 2024-05-01 VITALS — BP 130/82 | HR 68 | Temp 98.1°F | Ht 64.0 in | Wt 208.2 lb

## 2024-05-01 DIAGNOSIS — G4452 New daily persistent headache (NDPH): Secondary | ICD-10-CM | POA: Diagnosis not present

## 2024-05-01 DIAGNOSIS — F4310 Post-traumatic stress disorder, unspecified: Secondary | ICD-10-CM | POA: Diagnosis not present

## 2024-05-01 DIAGNOSIS — E66812 Obesity, class 2: Secondary | ICD-10-CM

## 2024-05-01 DIAGNOSIS — M542 Cervicalgia: Secondary | ICD-10-CM | POA: Diagnosis not present

## 2024-05-01 DIAGNOSIS — Z6835 Body mass index (BMI) 35.0-35.9, adult: Secondary | ICD-10-CM

## 2024-05-01 DIAGNOSIS — E6609 Other obesity due to excess calories: Secondary | ICD-10-CM

## 2024-05-01 DIAGNOSIS — F411 Generalized anxiety disorder: Secondary | ICD-10-CM
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
## 2024-05-01 NOTE — Patient Instructions (Signed)

## 2024-05-05 DIAGNOSIS — F4310 Post-traumatic stress disorder, unspecified: Secondary | ICD-10-CM | POA: Insufficient documentation

## 2024-05-05 DIAGNOSIS — G4452 New daily persistent headache (NDPH): Secondary | ICD-10-CM | POA: Insufficient documentation
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
## 2024-05-05 NOTE — Assessment & Plan Note (Signed)
 Persistent unilateral headaches extending to neck, not fully relieved by Fioricet. Suspected migraines. - Advise to apply pain cream on shoulders to alleviate tension contributing to headaches.

## 2024-05-05 NOTE — Assessment & Plan Note (Signed)
 She is encouraged to strive for BMI less than 30 to decrease cardiac risk. Advised to aim for at least 150 minutes of exercise per week.

## 2024-05-05 NOTE — Assessment & Plan Note (Signed)
 Neck pain and tension linked to PTSD and anxiety. Muscle relaxer ineffective and depleted. - Advise to apply pain cream or Vicks VapoRub on shoulders at night. - Encourage improved posture by bringing shoulders back. - Instruct to inform if muscle relaxer is not provided by workman's comp for prescription.

## 2024-05-06 ENCOUNTER — Ambulatory Visit: Payer: Self-pay
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
## 2024-06-03 ENCOUNTER — Encounter: Payer: Self-pay | Admitting: Internal Medicine

## 2024-06-03 ENCOUNTER — Ambulatory Visit (INDEPENDENT_AMBULATORY_CARE_PROVIDER_SITE_OTHER): Payer: PRIVATE HEALTH INSURANCE | Admitting: Internal Medicine

## 2024-06-03 ENCOUNTER — Other Ambulatory Visit (HOSPITAL_COMMUNITY)
Admission: RE | Admit: 2024-06-03 | Discharge: 2024-06-03 | Disposition: A | Discharge status: Home / Self Care | Source: Ambulatory Visit | Attending: Internal Medicine | Admitting: Internal Medicine

## 2024-06-03 VITALS — BP 110/70 | HR 85 | Temp 98.2°F | Ht 64.0 in | Wt 208.0 lb

## 2024-06-03 DIAGNOSIS — Z6835 Body mass index (BMI) 35.0-35.9, adult: Secondary | ICD-10-CM | POA: Diagnosis not present

## 2024-06-03 DIAGNOSIS — E7841 Elevated Lipoprotein(a): Secondary | ICD-10-CM | POA: Diagnosis not present

## 2024-06-03 DIAGNOSIS — Z124 Encounter for screening for malignant neoplasm of cervix: Secondary | ICD-10-CM

## 2024-06-03 DIAGNOSIS — E6609 Other obesity due to excess calories: Secondary | ICD-10-CM

## 2024-06-03 DIAGNOSIS — E66812 Obesity, class 2: Secondary | ICD-10-CM

## 2024-06-03 DIAGNOSIS — Z01419 Encounter for gynecological examination (general) (routine) without abnormal findings: Secondary | ICD-10-CM | POA: Insufficient documentation

## 2024-06-03 DIAGNOSIS — Z23 Encounter for immunization: Secondary | ICD-10-CM | POA: Diagnosis not present

## 2024-06-03 LAB — POC HEMOCCULT BLD/STL (OFFICE/1-CARD/DIAGNOSTIC): Fecal Occult Blood, POC: NEGATIVE

## 2024-06-03 NOTE — Patient Instructions (Signed)
 Coronary Calcium Scan  Fee - $99 A coronary calcium scan is an imaging test used to look for deposits of plaque in the inner lining of the blood vessels of the heart (coronary arteries). Plaque is made up of calcium, protein, and fatty substances. These deposits of plaque can partly clog and narrow the coronary arteries without producing any symptoms or warning signs. This puts a person at risk for a heart attack. A coronary calcium scan is performed using a computed tomography (CT) scanner machine without using a dye (contrast). This test is recommended for people who are at moderate risk for heart disease. The test can find plaque deposits before symptoms develop. Tell a health care provider about: Any allergies you have. All medicines you are taking, including vitamins, herbs, eye drops, creams, and over-the-counter medicines. Any problems you or family members have had with anesthetic medicines. Any bleeding problems you have. Any surgeries you have had. Any medical conditions you have. Whether you are pregnant or may be pregnant. What are the risks? Generally, this is a safe procedure. However, problems may occur, including: Harm to a pregnant woman and her unborn baby. This test involves the use of radiation. Radiation exposure can be dangerous to a pregnant woman and her unborn baby. If you are pregnant or think you may be pregnant, you should not have this procedure done. A slight increase in the risk of cancer. This is because of the radiation involved in the test. The amount of radiation from one test is similar to the amount of radiation you are naturally exposed to over one year. What happens before the procedure? Ask your health care provider for any specific instructions on how to prepare for this procedure. You may be asked to avoid products that contain caffeine, tobacco, or nicotine for 4 hours before the procedure. What happens during the procedure?  You will undress and  remove any jewelry from your neck or chest. You may need to remove hearing aides and dentures. Women may need to remove their bras. You will put on a hospital gown. Sticky electrodes will be placed on your chest. The electrodes will be connected to an electrocardiogram (ECG) machine to record a tracing of the electrical activity of your heart. You will lie down on your back on a curved bed that is attached to the CT scanner. You may be given medicine to slow down your heart rate so that clear pictures can be created. You will be moved into the CT scanner, and the CT scanner will take pictures of your heart. During this time, you will be asked to lie still and hold your breath for 10-20 seconds at a time while each picture of your heart is being taken. The procedure may vary among health care providers and hospitals. What can I expect after the procedure? You can return to your normal activities. It is up to you to get the results of your procedure. Ask your health care provider, or the department that is doing the procedure, when your results will be ready. Summary A coronary calcium scan is an imaging test used to look for deposits of plaque in the inner lining of the blood vessels of the heart. Plaque is made up of calcium, protein, and fatty substances. A coronary calcium scan is performed using a CT scanner machine without contrast. Generally, this is a safe procedure. Tell your health care provider if you are pregnant or may be pregnant. Ask your health care provider for any specific  instructions on how to prepare for this procedure. You can return to your normal activities after the scan is done. This information is not intended to replace advice given to you by your health care provider. Make sure you discuss any questions you have with your health care provider. Document Revised: 07/24/2021 Document Reviewed: 07/24/2021 Elsevier Patient Education  2024 [employer].

## 2024-06-03 NOTE — Assessment & Plan Note (Addendum)
 Genetic predisposition to cardiovascular disease, increasing risk of myocardial infarction or cerebrovascular accident. - Discussed potential use of cholesterol medication, noting muscle aches as a common side effect. - Recommended Coenzyme Q10 to mitigate muscle aches. - Consider coronary calcium score to assess arterial calcifications, informed her of the $99 cost and lack of insurance coverage. - Provided information on coronary calcium score in after visit summary. - Consider coronary calcium score if she opts against immediate cholesterol medication. - She prefers to get calcium scoring prior to starting meds, will refer

## 2024-06-03 NOTE — Progress Notes (Signed)
 I,Amy Greer, CMA,acting as a Neurosurgeon for Amy LOISE Slocumb, MD.,have documented all relevant documentation on the behalf of Amy LOISE Slocumb, MD,as directed by  Amy LOISE Slocumb, MD while in the presence of Amy LOISE Slocumb, MD.  Subjective:  Patient ID: Amy Greer , female    DOB: 12-06-1955 , 68 y.o.   MRN: [id number]  Chief Complaint  Patient presents with   pap smear    Patient presents today for a pap smear. Patient doesn't have any questions or concerns at this time.    HPI Discussed the use of AI scribe software for clinical note transcription with the patient, who gave verbal consent to proceed.  History of Present Illness Amy Greer is a 68 year old female who presents for a Pap smear and discussion of elevated cholesterol levels.  She is undergoing a routine Pap smear with no history of abnormal results or hysterectomy. She is not currently sexually active and has no need for STI screening. No vaginal discharge or discomfort.  She has elevated cholesterol levels, specifically elevated lipoprotein A and small dense LDL particles. Her LDL cholesterol is 83 mg/dL. She participates in water aerobics as a lifestyle change.  She is currently out of work until 2027 due to PTSD and is on workers' compensation. She is considering purchasing new shoes to support her exercise routine.    Past Medical History:  Diagnosis Date   Anxiety    Depression    Dysrhythmia    occasional paplitations   GERD (gastroesophageal reflux disease)    r/t hiatal hernia   History of hiatal hernia    Lower extremity edema    Peripheral vascular disease    Chronic Venous Insufficiency   Pneumonia    Sleep apnea      Family History  Problem Relation Age of Onset   Kidney disease Mother    Hyperlipidemia Mother    Lung cancer Sister    Lung cancer Brother    Ulcers Father      Current Outpatient Medications:    carboxymethylcellulose (REFRESH PLUS) 0.5 % SOLN, Place 1  drop into both eyes 3 (three) times daily as needed (dry eyes)., Disp: , Rfl:    Cholecalciferol (VITAMIN D3) 1000 units CAPS, Take 3,000 Units by mouth daily., Disp: , Rfl:    famotidine  (PEPCID ) 40 MG tablet, Take 1 tablet (40 mg total) by mouth at bedtime., Disp: 90 tablet, Rfl: 2   metoCLOPramide  (REGLAN ) 10 MG tablet, Take 1 tablet (10 mg total) by mouth 3 (three) times daily with meals., Disp: 90 tablet, Rfl: 1   Omega-3 Fatty Acids (OMEGA 3 PO), Take 1 capsule by mouth daily., Disp: , Rfl:    ondansetron  (ZOFRAN -ODT) 4 MG disintegrating tablet, Take 1 tablet (4 mg total) by mouth every 6 (six) hours as needed for nausea., Disp: 20 tablet, Rfl: 0   sertraline  (ZOLOFT ) 50 MG tablet, TAKE 1 TABLET BY MOUTH AT BEDTIME, Disp: 90 tablet, Rfl: 2   Allergies  Allergen Reactions   Penicillins Swelling    SWELLING OF THE THROAT      Review of Systems  Constitutional: Negative.   Eyes: Negative.   Respiratory: Negative.    Cardiovascular: Negative.   Gastrointestinal: Negative.      Today's Vitals   06/03/24 1201  BP: 110/70  Pulse: 85  Temp: 98.2 F (36.8 C)  Weight: 208 lb (94.3 kg)  Height: 5' 4 (1.626 m)   Body mass index is 35.7 kg/m.  Wt Readings from Last 3 Encounters:  06/03/24 208 lb (94.3 kg)  05/01/24 208 lb 3.2 oz (94.4 kg)  01/29/24 205 lb 9.6 oz (93.3 kg)    The 10-year ASCVD risk score (Arnett DK, et al., 2019) is: 4.7%   Values used to calculate the score:     Age: 10 years     Clincally relevant sex: Female     Is Non-Hispanic African American: Yes     Diabetic: No     Tobacco smoker: No     Systolic Blood Pressure: 110 mmHg     Is BP treated: No     HDL Cholesterol: 59 mg/dL     Total Cholesterol: 150 mg/dL  Objective:  Physical Exam Vitals and nursing note reviewed. Exam conducted with a chaperone present.  Constitutional:      Appearance: Normal appearance.  HENT:     Head: Normocephalic and atraumatic.  Eyes:     Extraocular Movements:  Extraocular movements intact.  Cardiovascular:     Rate and Rhythm: Normal rate and regular rhythm.     Heart sounds: Normal heart sounds.  Pulmonary:     Effort: Pulmonary effort is normal.     Breath sounds: Normal breath sounds.  Abdominal:     Hernia: There is no hernia in the left inguinal area or right inguinal area.  Genitourinary:    Exam position: Supine.     Vagina: Normal.     Cervix: Normal.     Rectum: Guaiac result negative. No mass.  Musculoskeletal:     Cervical back: Normal range of motion.  Lymphadenopathy:     Lower Body: No right inguinal adenopathy. No left inguinal adenopathy.  Skin:    General: Skin is warm.  Neurological:     General: No focal deficit present.     Mental Status: She is alert.  Psychiatric:        Mood and Affect: Mood normal.        Behavior: Behavior normal.         Assessment And Plan:  Elevated lipoprotein(a) Assessment & Plan: Genetic predisposition to cardiovascular disease, increasing risk of myocardial infarction or cerebrovascular accident. - Discussed potential use of cholesterol medication, noting muscle aches as a common side effect. - Recommended Coenzyme Q10 to mitigate muscle aches. - Consider coronary calcium score to assess arterial calcifications, informed her of the $99 cost and lack of insurance coverage. - Provided information on coronary calcium score in after visit summary. - Consider coronary calcium score if she opts against immediate cholesterol medication. - She prefers to get calcium scoring prior to starting meds, will refer  Orders: -     CT CARDIAC SCORING (SELF PAY ONLY); Future  Class 2 obesity due to excess calories without serious comorbidity with body mass index (BMI) of 35.0 to 35.9 in adult Assessment & Plan: Class 2 obesity present. - Encouraged regular physical activity, such as water aerobics, five days a week. - Set a goal to lose ten pounds by the end of the year.   Pap smear for  cervical cancer screening -     Cytology - PAP -     POC Hemoccult Bld/Stl (1-Cd Office Dx)  Need for influenza vaccination -     Flu vaccine HIGH DOSE PF(Fluzone Trivalent)   Return if symptoms worsen or fail to improve.  Patient was given opportunity to ask questions. Patient verbalized understanding of the plan and was able to repeat key elements of the plan.  All questions were answered to their satisfaction.    I, Amy LOISE Slocumb, MD, have reviewed all documentation for this visit. The documentation on 06/03/24 for the exam, diagnosis, procedures, and orders are all accurate and complete.   IF YOU HAVE BEEN REFERRED TO A SPECIALIST, IT MAY TAKE 1-2 WEEKS TO SCHEDULE/PROCESS THE REFERRAL. IF YOU HAVE NOT HEARD FROM US /SPECIALIST IN TWO WEEKS, PLEASE GIVE US  A CALL AT [phone] X [phone].

## 2024-06-03 NOTE — Assessment & Plan Note (Signed)
 Class 2 obesity present. - Encouraged regular physical activity, such as water aerobics, five days a week. - Set a goal to lose ten pounds by the end of the year.

## 2024-06-05 ENCOUNTER — Ambulatory Visit (HOSPITAL_BASED_OUTPATIENT_CLINIC_OR_DEPARTMENT_OTHER)
Admission: RE | Admit: 2024-06-05 | Discharge: 2024-06-05 | Disposition: A | Discharge status: Home / Self Care | Payer: Self-pay | Source: Ambulatory Visit | Attending: Internal Medicine | Admitting: Internal Medicine

## 2024-06-05 ENCOUNTER — Ambulatory Visit: Payer: Self-pay | Admitting: Internal Medicine

## 2024-06-05 DIAGNOSIS — E7841 Elevated Lipoprotein(a): Secondary | ICD-10-CM | POA: Insufficient documentation

## 2024-06-05 LAB — CYTOLOGY - PAP
Comment: NEGATIVE
Diagnosis: NEGATIVE
High risk HPV: NEGATIVE

## 2024-08-05 LAB — HM MAMMOGRAPHY

## 2024-08-07 ENCOUNTER — Encounter: Payer: Self-pay | Admitting: Internal Medicine

## 2024-08-14 ENCOUNTER — Ambulatory Visit: Admitting: Internal Medicine

## 2024-08-14 ENCOUNTER — Encounter: Payer: Self-pay | Admitting: Internal Medicine

## 2024-08-14 VITALS — BP 110/68 | HR 64 | Temp 98.4°F | Ht 64.0 in | Wt 214.0 lb

## 2024-08-14 DIAGNOSIS — E7841 Elevated Lipoprotein(a): Secondary | ICD-10-CM

## 2024-08-14 DIAGNOSIS — R7309 Other abnormal glucose: Secondary | ICD-10-CM | POA: Diagnosis not present

## 2024-08-14 DIAGNOSIS — F411 Generalized anxiety disorder: Secondary | ICD-10-CM | POA: Diagnosis not present

## 2024-08-14 DIAGNOSIS — R110 Nausea: Secondary | ICD-10-CM

## 2024-08-14 DIAGNOSIS — K449 Diaphragmatic hernia without obstruction or gangrene: Secondary | ICD-10-CM

## 2024-08-14 DIAGNOSIS — R112 Nausea with vomiting, unspecified: Secondary | ICD-10-CM | POA: Diagnosis not present

## 2024-08-14 DIAGNOSIS — D72829 Elevated white blood cell count, unspecified: Secondary | ICD-10-CM | POA: Diagnosis not present

## 2024-08-14 DIAGNOSIS — R7989 Other specified abnormal findings of blood chemistry: Secondary | ICD-10-CM

## 2024-08-14 MED ORDER — FAMOTIDINE 20 MG PO TABS: 20.0000 mg | ORAL_TABLET | Freq: Two times a day (BID) | ORAL | 0 refills | Status: AC

## 2024-08-14 NOTE — Progress Notes (Unsigned)
 I,Amy Greer, CMA,acting as a neurosurgeon for Amy LOISE Slocumb, MD.,have documented all relevant documentation on the behalf of Amy LOISE Slocumb, MD,as directed by  Amy LOISE Slocumb, MD while in the presence of Amy LOISE Slocumb, MD.  Subjective:  Patient ID: Amy Greer , female    DOB: 07-28-1956 , 68 y.o.   MRN: [id number]  Chief Complaint  Patient presents with   Elevated Lipoprotein    Patient presents today for 6 month follow up. She denies headache, chest pain & sob. She states going to Dr Elmer office before coming here. She wanted to do follow up blood work to make sure no other hernia could possibly be developing. She did have a procedure for removal in May.  At Dr Elmer office she reports no phlebotomist was available there today. Which resulted her to come here today.    HPI Discussed the use of AI scribe software for clinical note transcription with the patient, who gave verbal consent to proceed.  History of Present Illness    HPI   Past Medical History:  Diagnosis Date   Anxiety    Depression    Dysrhythmia    occasional paplitations   GERD (gastroesophageal reflux disease)    r/t hiatal hernia   History of hiatal hernia    Lower extremity edema    Peripheral vascular disease    Chronic Venous Insufficiency   Pneumonia    Sleep apnea      Family History  Problem Relation Age of Onset   Kidney disease Mother    Hyperlipidemia Mother    Lung cancer Sister    Lung cancer Brother    Ulcers Father     Current Medications[1]   Allergies[2]   Review of Systems  Constitutional: Negative.   Respiratory: Negative.    Cardiovascular: Negative.   Neurological: Negative.   Psychiatric/Behavioral: Negative.       Today's Vitals   08/14/24 1442  BP: 110/68  Pulse: 64  Temp: 98.4 F (36.9 C)  SpO2: 98%  Weight: 214 lb (97.1 kg)  Height: 5' 4 (1.626 m)   Body mass index is 36.73 kg/m.  Wt Readings from Last 3 Encounters:  08/14/24 214 lb  (97.1 kg)  06/03/24 208 lb (94.3 kg)  05/01/24 208 lb 3.2 oz (94.4 kg)    The 10-year ASCVD risk score (Arnett DK, et al., 2019) is: 5.1%   Values used to calculate the score:     Age: 6 years     Clinically relevant sex: Female     Is Non-Hispanic African American: Yes     Diabetic: No     Tobacco smoker: No     Systolic Blood Pressure: 110 mmHg     Is BP treated: No     HDL Cholesterol: 59 mg/dL     Total Cholesterol: 150 mg/dL   Objective:  Physical Exam Vitals and nursing note reviewed.  Constitutional:      Appearance: Normal appearance.  HENT:     Head: Normocephalic and atraumatic.  Eyes:     Extraocular Movements: Extraocular movements intact.  Cardiovascular:     Rate and Rhythm: Normal rate and regular rhythm.     Heart sounds: Normal heart sounds.  Pulmonary:     Effort: Pulmonary effort is normal.     Breath sounds: Normal breath sounds.  Musculoskeletal:     Cervical back: Normal range of motion.  Skin:    General: Skin is warm.  Neurological:  General: No focal deficit present.     Mental Status: She is alert.  Psychiatric:        Mood and Affect: Mood normal.        Behavior: Behavior normal.         Assessment And Plan:  Nausea  Hiatal hernia  GAD (generalized anxiety disorder)  Hypocalcemia  Elevated LFTs  Leukocytosis, unspecified type  Other abnormal glucose   Assessment & Plan   Return if symptoms worsen or fail to improve.  Patient was given opportunity to ask questions. Patient verbalized understanding of the plan and was able to repeat key elements of the plan. All questions were answered to their satisfaction.   I, Amy LOISE Slocumb, MD, have reviewed all documentation for this visit. The documentation on 08/14/2024 for the exam, diagnosis, procedures, and orders are all accurate and complete.   IF YOU HAVE BEEN REFERRED TO A SPECIALIST, IT MAY TAKE 1-2 WEEKS TO SCHEDULE/PROCESS THE REFERRAL. IF YOU HAVE NOT HEARD FROM  US /SPECIALIST IN TWO WEEKS, PLEASE GIVE US  A CALL AT [phone].   THE PATIENT IS ENCOURAGED TO PRACTICE SOCIAL DISTANCING DUE TO THE COVID-19 PANDEMIC.       [1]  Current Outpatient Medications:    carboxymethylcellulose (REFRESH PLUS) 0.5 % SOLN, Place 1 drop into both eyes 3 (three) times daily as needed (dry eyes)., Disp: , Rfl:    Cholecalciferol (VITAMIN D3) 1000 units CAPS, Take 3,000 Units by mouth daily., Disp: , Rfl:    famotidine  (PEPCID ) 40 MG tablet, Take 1 tablet (40 mg total) by mouth at bedtime., Disp: 90 tablet, Rfl: 2   metoCLOPramide  (REGLAN ) 10 MG tablet, Take 1 tablet (10 mg total) by mouth 3 (three) times daily with meals., Disp: 90 tablet, Rfl: 1   Omega-3 Fatty Acids (OMEGA 3 PO), Take 1 capsule by mouth daily., Disp: , Rfl:    ondansetron  (ZOFRAN -ODT) 4 MG disintegrating tablet, Take 1 tablet (4 mg total) by mouth every 6 (six) hours as needed for nausea., Disp: 20 tablet, Rfl: 0   propranolol (INDERAL) 10 MG tablet, Take 10 mg by mouth 2 (two) times daily., Disp: , Rfl:    sertraline  (ZOLOFT ) 50 MG tablet, TAKE 1 TABLET BY MOUTH AT BEDTIME, Disp: 90 tablet, Rfl: 2 [2]  Allergies Allergen Reactions   Penicillins Swelling    SWELLING OF THE THROAT

## 2024-08-14 NOTE — Patient Instructions (Signed)
 Cholesterol Content in Foods Cholesterol is a waxy, fat-like substance that helps to carry fat in the blood. The body needs cholesterol in small amounts, but too much cholesterol can cause damage to the arteries and heart. What foods have cholesterol?  Cholesterol is found in animal-based foods, such as meat, seafood, and dairy. Generally, low-fat dairy and lean meats have less cholesterol than full-fat dairy and fatty meats. The milligrams of cholesterol per serving (mg per serving) of common cholesterol-containing foods are listed below. Meats and other proteins Egg -- one large whole egg has 186 mg. Veal shank -- 4 oz (113 g) has 141 mg. Lean ground [place] (93% lean) -- 4 oz (113 g) has 118 mg. Fat-trimmed lamb loin -- 4 oz (113 g) has 106 mg. Lean ground beef (90% lean) -- 4 oz (113 g) has 100 mg. Lobster -- 3.5 oz (99 g) has 90 mg. Pork loin chops -- 4 oz (113 g) has 86 mg. Canned salmon -- 3.5 oz (99 g) has 83 mg. Fat-trimmed beef top loin -- 4 oz (113 g) has 78 mg. Frankfurter -- 1 frank (3.5 oz or 99 g) has 77 mg. Crab -- 3.5 oz (99 g) has 71 mg. Roasted chicken without skin, white meat -- 4 oz (113 g) has 66 mg. Light bologna -- 2 oz (57 g) has 45 mg. Deli-cut [place] -- 2 oz (57 g) has 31 mg. Canned tuna -- 3.5 oz (99 g) has 31 mg. Amy Greer -- 1 oz (28 g) has 29 mg. Oysters and mussels (raw) -- 3.5 oz (99 g) has 25 mg. Mackerel -- 1 oz (28 g) has 22 mg. Trout -- 1 oz (28 g) has 20 mg. Pork sausage -- 1 link (1 oz or 28 g) has 17 mg. Salmon -- 1 oz (28 g) has 16 mg. Tilapia -- 1 oz (28 g) has 14 mg. Dairy Soft-serve ice cream --  cup (4 oz or 86 g) has 103 mg. Whole-milk yogurt -- 1 cup (8 oz or 245 g) has 29 mg. Cheddar cheese -- 1 oz (28 g) has 28 mg. American cheese -- 1 oz (28 g) has 28 mg. Whole milk -- 1 cup (8 oz or 250 mL) has 23 mg. 2% milk -- 1 cup (8 oz or 250 mL) has 18 mg. Cream cheese -- 1 tablespoon (Tbsp) (14.5 g) has 15 mg. Cottage cheese --  cup (4 oz or  113 g) has 14 mg. Low-fat (1%) milk -- 1 cup (8 oz or 250 mL) has 10 mg. Sour cream -- 1 Tbsp (12 g) has 8.5 mg. Low-fat yogurt -- 1 cup (8 oz or 245 g) has 8 mg. Nonfat Greek yogurt -- 1 cup (8 oz or 228 g) has 7 mg. Half-and-half cream -- 1 Tbsp (15 mL) has 5 mg. Fats and oils Cod liver oil -- 1 tablespoon (Tbsp) (13.6 g) has 82 mg. Butter -- 1 Tbsp (14 g) has 15 mg. Lard -- 1 Tbsp (12.8 g) has 14 mg. Bacon grease -- 1 Tbsp (12.9 g) has 14 mg. Mayonnaise -- 1 Tbsp (13.8 g) has 5-10 mg. Margarine -- 1 Tbsp (14 g) has 3-10 mg. The items listed above may not be a complete list of foods with cholesterol. Exact amounts of cholesterol in these foods may vary depending on specific ingredients and brands. Contact a dietitian for more information. What foods do not have cholesterol? Most plant-based foods do not have cholesterol unless you combine them with a food that has  cholesterol. Foods without cholesterol include: Grains and cereals. Vegetables. Fruits. Vegetable oils, such as olive, canola, and sunflower oil. Legumes, such as peas, beans, and lentils. Nuts and seeds. Egg whites. The items listed above may not be a complete list of foods that do not have cholesterol. Contact a dietitian for more information. Summary The body needs cholesterol in small amounts, but too much cholesterol can cause damage to the arteries and heart. Cholesterol is found in animal-based foods, such as meat, seafood, and dairy. Generally, low-fat dairy and lean meats have less cholesterol than full-fat dairy and fatty meats. This information is not intended to replace advice given to you by your health care provider. Make sure you discuss any questions you have with your health care provider. Document Revised: 12/24/2020 Document Reviewed: 12/24/2020 Elsevier Patient Education  2024 [employer].

## 2024-08-15 LAB — CMP14+EGFR
ALT: 17 IU/L (ref 0–32)
AST: 16 IU/L (ref 0–40)
Albumin: 4.1 g/dL (ref 3.9–4.9)
Alkaline Phosphatase: 96 IU/L (ref 49–135)
BUN/Creatinine Ratio: 9 — ABNORMAL LOW (ref 12–28)
BUN: 8 mg/dL (ref 8–27)
Bilirubin Total: 0.3 mg/dL (ref 0.0–1.2)
CO2: 25 mmol/L (ref 20–29)
Calcium: 9.3 mg/dL (ref 8.7–10.3)
Chloride: 103 mmol/L (ref 96–106)
Creatinine, Ser: 0.85 mg/dL (ref 0.57–1.00)
Globulin, Total: 3 g/dL (ref 1.5–4.5)
Glucose: 102 mg/dL — ABNORMAL HIGH (ref 70–99)
Potassium: 3.8 mmol/L (ref 3.5–5.2)
Sodium: 140 mmol/L (ref 134–144)
Total Protein: 7.1 g/dL (ref 6.0–8.5)
eGFR: 75 mL/min/1.73

## 2024-08-15 LAB — HEMOGLOBIN A1C
Est. average glucose Bld gHb Est-mCnc: 128 mg/dL
Hgb A1c MFr Bld: 6.1 % — ABNORMAL HIGH (ref 4.8–5.6)

## 2024-08-15 LAB — CBC
Hematocrit: 41.7 % (ref 34.0–46.6)
Hemoglobin: 13.8 g/dL (ref 11.1–15.9)
MCH: 31.2 pg (ref 26.6–33.0)
MCHC: 33.1 g/dL (ref 31.5–35.7)
MCV: 94 fL (ref 79–97)
Platelets: 307 x10E3/uL (ref 150–450)
RBC: 4.43 x10E6/uL (ref 3.77–5.28)
RDW: 13.1 % (ref 11.7–15.4)
WBC: 6.4 x10E3/uL (ref 3.4–10.8)

## 2024-08-17 NOTE — Assessment & Plan Note (Signed)
 Previous labs reviewed, her A1c has been elevated in the past. I will check an A1c today. Reminded to avoid refined sugars including sugary drinks/foods and processed meats including bacon, sausages and deli meats.

## 2024-08-17 NOTE — Assessment & Plan Note (Signed)
 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 Calcium score zero, indicating low current cardiac risk. - Encourage exercise for 30 minutes, five days a week, including vibration plate use. - Consider future cholesterol medication if necessary.

## 2024-08-17 NOTE — Assessment & Plan Note (Signed)
 Currently on sertraline  for mood management. Recent dosage increase by psychologist. Reports reduced anxiety but experiences grogginess. - Continue sertraline  as prescribed. - Monitor for side effects and effectiveness of increased dosage.

## 2024-08-17 NOTE — Assessment & Plan Note (Signed)
 Intermittent nausea and vomiting likely due to sinus drainage and acid reflux. Hiatal hernia repair in May 2025. Lack of famotidine  or metoclopramide  may contribute to symptoms. - Prescribe lower dose famotidine  for 90 days to manage acid reflux and nausea. - Will switch to PPI therapy if famotidine  is ineffective.  - Advise to stop eating 3 hours before bedtime and avoid known reflux triggers.

## 2024-08-17 NOTE — Assessment & Plan Note (Signed)
 Hiatal hernia repair in May 2025. Recent CT scan showed moderate hernia, possibly scarring from surgery. Barium swallow study did not show reflux.

## 2024-08-20 ENCOUNTER — Ambulatory Visit: Payer: Self-pay | Admitting: Internal Medicine

## 2024-09-18 ENCOUNTER — Encounter: Payer: Self-pay | Admitting: Internal Medicine

## 2025-02-04 ENCOUNTER — Encounter: Payer: Self-pay | Admitting: Internal Medicine
